# Patient Record
Sex: Male | Born: 1970 | Race: Black or African American | Hispanic: No | Marital: Married | Smoking: Former smoker
Health system: Southern US, Community
[De-identification: ages and names within clinical notes are randomized; demographics above are authoritative.]

## PROBLEM LIST (undated history)

## (undated) DIAGNOSIS — M069 Rheumatoid arthritis, unspecified: Secondary | ICD-10-CM

## (undated) DIAGNOSIS — K611 Rectal abscess: Secondary | ICD-10-CM

## (undated) DIAGNOSIS — D649 Anemia, unspecified: Secondary | ICD-10-CM

## (undated) DIAGNOSIS — N912 Amenorrhea, unspecified: Secondary | ICD-10-CM

## (undated) DIAGNOSIS — M545 Low back pain, unspecified: Secondary | ICD-10-CM

## (undated) DIAGNOSIS — K5790 Diverticulosis of intestine, part unspecified, without perforation or abscess without bleeding: Secondary | ICD-10-CM

## (undated) DIAGNOSIS — M160 Bilateral primary osteoarthritis of hip: Secondary | ICD-10-CM

## (undated) DIAGNOSIS — M4800 Spinal stenosis, site unspecified: Secondary | ICD-10-CM

## (undated) DIAGNOSIS — K605 Anorectal fistula, unspecified: Secondary | ICD-10-CM

## (undated) DIAGNOSIS — D472 Monoclonal gammopathy: Secondary | ICD-10-CM

## (undated) DIAGNOSIS — K5090 Crohn's disease, unspecified, without complications: Secondary | ICD-10-CM

## (undated) DIAGNOSIS — M47816 Spondylosis without myelopathy or radiculopathy, lumbar region: Secondary | ICD-10-CM

## (undated) DIAGNOSIS — M7121 Synovial cyst of popliteal space [Baker], right knee: Secondary | ICD-10-CM

## (undated) DIAGNOSIS — I959 Hypotension, unspecified: Secondary | ICD-10-CM

## (undated) HISTORY — DX: Amenorrhea, unspecified: N91.2

## (undated) HISTORY — DX: Anemia, unspecified: D64.9

## (undated) HISTORY — DX: Crohn's disease, unspecified, without complications: K50.90

## (undated) HISTORY — DX: Hypotension, unspecified: I95.9

---

## 2003-03-31 HISTORY — PX: BONE MARROW BIOPSY: SHX199

## 2003-06-19 ENCOUNTER — Encounter: Admission: RE | Admit: 2003-06-19 | Discharge: 2003-06-19 | Payer: Self-pay | Admitting: Rheumatology

## 2004-12-18 ENCOUNTER — Encounter: Admission: RE | Admit: 2004-12-18 | Discharge: 2004-12-18 | Payer: Self-pay

## 2011-03-31 DIAGNOSIS — K611 Rectal abscess: Secondary | ICD-10-CM

## 2011-03-31 HISTORY — DX: Rectal abscess: K61.1

## 2011-03-31 HISTORY — PX: CIRCUMCISION: SUR203

## 2011-09-14 ENCOUNTER — Encounter (HOSPITAL_BASED_OUTPATIENT_CLINIC_OR_DEPARTMENT_OTHER): Payer: Self-pay

## 2011-09-14 ENCOUNTER — Emergency Department (HOSPITAL_BASED_OUTPATIENT_CLINIC_OR_DEPARTMENT_OTHER): Payer: BC Managed Care – PPO

## 2011-09-14 ENCOUNTER — Emergency Department (HOSPITAL_BASED_OUTPATIENT_CLINIC_OR_DEPARTMENT_OTHER)
Admission: EM | Admit: 2011-09-14 | Discharge: 2011-09-14 | Disposition: A | Discharge status: Home / Self Care | Payer: BC Managed Care – PPO | Attending: Emergency Medicine | Admitting: Emergency Medicine

## 2011-09-14 DIAGNOSIS — K612 Anorectal abscess: Secondary | ICD-10-CM | POA: Insufficient documentation

## 2011-09-14 DIAGNOSIS — L0231 Cutaneous abscess of buttock: Secondary | ICD-10-CM | POA: Insufficient documentation

## 2011-09-14 DIAGNOSIS — L0291 Cutaneous abscess, unspecified: Secondary | ICD-10-CM

## 2011-09-14 HISTORY — DX: Monoclonal gammopathy: D47.2

## 2011-09-14 LAB — DIFFERENTIAL
Basophils Relative: 0 % (ref 0–1)
Eosinophils Absolute: 0.1 10*3/uL (ref 0.0–0.7)
Eosinophils Relative: 0 % (ref 0–5)
Lymphs Abs: 1.5 10*3/uL (ref 0.7–4.0)
Monocytes Absolute: 1.2 10*3/uL — ABNORMAL HIGH (ref 0.1–1.0)
Monocytes Relative: 9 % (ref 3–12)

## 2011-09-14 LAB — BASIC METABOLIC PANEL
BUN: 7 mg/dL (ref 6–23)
Calcium: 9.2 mg/dL (ref 8.4–10.5)
Creatinine, Ser: 0.7 mg/dL (ref 0.50–1.35)
GFR calc Af Amer: >90 mL/min (ref >90)
GFR calc non Af Amer: >90 mL/min (ref >90)
Glucose, Bld: 90 mg/dL (ref 70–99)

## 2011-09-14 LAB — URINE MICROSCOPIC-ADD ON

## 2011-09-14 LAB — URINALYSIS, ROUTINE W REFLEX MICROSCOPIC
Ketones, ur: 15 mg/dL — AB
Protein, ur: 30 mg/dL — AB
Specific Gravity, Urine: 1.026 (ref 1.005–1.030)
Urobilinogen, UA: 1 mg/dL (ref 0.0–1.0)

## 2011-09-14 LAB — CBC
Hemoglobin: 12.2 g/dL — ABNORMAL LOW (ref 13.0–17.0)
MCH: 25.7 pg — ABNORMAL LOW (ref 26.0–34.0)
MCHC: 34.3 g/dL (ref 30.0–36.0)
MCV: 74.9 fL — ABNORMAL LOW (ref 78.0–100.0)
RBC: 4.75 MIL/uL (ref 4.22–5.81)

## 2011-09-14 MED ORDER — ONDANSETRON HCL 4 MG/2ML IJ SOLN
4.0000 mg | Freq: Once | INTRAMUSCULAR | Status: AC
  Administered 2011-09-14: 4 mg via INTRAVENOUS
  Filled 2011-09-14: qty 2

## 2011-09-14 MED ORDER — OXYCODONE-ACETAMINOPHEN 5-325 MG PO TABS: 2.0000 | ORAL_TABLET | Freq: Once | ORAL | Status: AC

## 2011-09-14 MED ORDER — SULFAMETHOXAZOLE-TMP DS 800-160 MG PO TABS
1.0000 | ORAL_TABLET | Freq: Once | ORAL | Status: AC
  Administered 2011-09-14: 1 via ORAL
  Filled 2011-09-14: qty 1

## 2011-09-14 MED ORDER — MORPHINE SULFATE 4 MG/ML IJ SOLN
4.0000 mg | Freq: Once | INTRAMUSCULAR | Status: AC
  Administered 2011-09-14: 4 mg via INTRAVENOUS
  Filled 2011-09-14: qty 1

## 2011-09-14 MED ORDER — SULFAMETHOXAZOLE-TMP DS 800-160 MG PO TABS: 1.0000 | ORAL_TABLET | Freq: Two times a day (BID) | ORAL | Status: DC

## 2011-09-14 MED ORDER — OXYCODONE-ACETAMINOPHEN 5-325 MG PO TABS
2.0000 | ORAL_TABLET | Freq: Once | ORAL | Status: AC
  Administered 2011-09-14: 2 via ORAL
  Filled 2011-09-14: qty 2

## 2011-09-14 MED ORDER — IOHEXOL 300 MG/ML  SOLN
100.0000 mL | Freq: Once | INTRAMUSCULAR | Status: AC | PRN
  Administered 2011-09-14: 100 mL via INTRAVENOUS

## 2011-09-14 MED ORDER — LIDOCAINE HCL (PF) 1 % IJ SOLN
5.0000 mL | Freq: Once | INTRAMUSCULAR | Status: AC
  Administered 2011-09-14: 5 mL
  Filled 2011-09-14: qty 5

## 2011-09-14 NOTE — Discharge Instructions (Signed)

## 2011-09-14 NOTE — ED Provider Notes (Signed)
History   This chart was scribed for Malvin Johns, MD by Kathreen Cornfield. The patient was seen in room MH05/MH05 and the patient's care was started at 5:27 PM     CSN: [id number]  Arrival date & time 09/14/11  1629   First MD Initiated Contact with Patient 09/14/11 1703      Chief Complaint  Patient presents with  . Wound Infection    (Consider location/radiation/quality/duration/timing/severity/associated sxs/prior treatment) HPI  Kerry Harrison is a 41 y.o. male who presents to the Emergency Department complaining of moderate, constant wound infection onset one week ago with associated symptoms of nausea, fever, diarrhea. The pt states "I have an abscess." Pt informs the EDP that "I went to wipe and there was a hard spot." Pt states "it has been like this for a couple of months.Over last week, the swelling has markedly increased and become more painful.  Has been using ice to area.  The pt informs the EDP that "he has went to the bathroom four times." Pt has a hx of stomach virus (diagnosed last month) for which he was taking "antibiotics".  Does have some pain to lower abd and pain to abscess area with BM.  +fevers, subjective.  +hx of MRSA in family   Past Medical History  Diagnosis Date  . Arthritis   . Monoclonal gammopathy       History  Substance Use Topics  . Smoking status: Never Smoker   . Smokeless tobacco: Never Used  . Alcohol Use: No      Review of Systems  Constitutional: Negative for fever, chills, diaphoresis and fatigue.  HENT: Negative for congestion, rhinorrhea and sneezing.   Eyes: Negative.   Respiratory: Negative for cough, chest tightness and shortness of breath.   Cardiovascular: Negative for chest pain and leg swelling.  Gastrointestinal: Positive for abdominal pain and rectal pain. Negative for nausea, vomiting, diarrhea and blood in stool.  Genitourinary: Negative for frequency, hematuria, flank pain and difficulty urinating.  Musculoskeletal:  Negative for back pain and arthralgias.  Skin: Negative for rash.  Neurological: Negative for dizziness, speech difficulty, weakness, numbness and headaches.    Allergies  Review of patient's allergies indicates no known allergies.  Home Medications   Current Outpatient Rx  Name Route Sig Dispense Refill  . NORTRIPTYLINE HCL 10 MG PO CAPS Oral Take 10 mg by mouth at bedtime.    . SULFAMETHOXAZOLE-TMP DS 800-160 MG PO TABS Oral Take 1 tablet by mouth 2 (two) times daily.    . TRAMADOL HCL 50 MG PO TABS Oral Take 50 mg by mouth every 6 (six) hours as needed. Patient used this medication for pain.    . OXYCODONE-ACETAMINOPHEN 5-325 MG PO TABS Oral Take 2 tablets by mouth once. 30 tablet 0  . SULFAMETHOXAZOLE-TMP DS 800-160 MG PO TABS Oral Take 1 tablet by mouth 2 (two) times daily. 20 tablet 0    BP 110/77  Pulse 117  Temp 98.4 F (36.9 C) (Oral)  Resp 18  Ht 6' 1"  (1.854 m)  Wt 246 lb (111.585 kg)  BMI 32.46 kg/m2  SpO2 98%  Physical Exam  Nursing note and vitals reviewed. Constitutional: He is oriented to person, place, and time. He appears well-developed and well-nourished.  HENT:  Head: Normocephalic and atraumatic.  Eyes: Pupils are equal, round, and reactive to light.  Neck: Normal range of motion. Neck supple.  Cardiovascular: Normal rate, regular rhythm and normal heart sounds.   Pulmonary/Chest: Effort normal and breath sounds  normal. No respiratory distress. He has no wheezes. He has no rales. He exhibits no tenderness.  Abdominal: Soft. Bowel sounds are normal. There is tenderness (mild across the lower abd). There is no rebound and no guarding.  Genitourinary:       2 cm fluctuant area to left buttocks adjacent to the anus, with pain to the perirectal area.   Musculoskeletal: Normal range of motion. He exhibits no edema.  Lymphadenopathy:    He has no cervical adenopathy.  Neurological: He is alert and oriented to person, place, and time.  Skin: Skin is warm and  dry. No rash noted.  Psychiatric: He has a normal mood and affect.    ED Course  INCISION AND DRAINAGE Date/Time: 09/14/2011 8:37 PM Performed by: Duran Ohern Authorized by: Malvin Johns Consent: Verbal consent obtained. Risks and benefits: risks, benefits and alternatives were discussed Consent given by: patient Patient understanding: patient states understanding of the procedure being performed Patient identity confirmed: verbally with patient Time out: Immediately prior to procedure a "time out" was called to verify the correct patient, procedure, equipment, support staff and site/side marked as required. Type: abscess Location: left buttocks. Anesthesia: local infiltration Local anesthetic: lidocaine 1% without epinephrine Anesthetic total: 5 ml Patient sedated: no Scalpel size: 11 Incision type: single straight Complexity: simple Drainage: purulent Drainage amount: copious Wound treatment: wound left open Packing material: 1/4 in iodoform gauze Patient tolerance: Patient tolerated the procedure well with no immediate complications.   (including critical care time)  DIAGNOSTIC STUDIES: Oxygen Saturation is 98% on room air, normal by my interpretation.    COORDINATION OF CARE:   5:31PM- EDP at bedside discusses treatment plan concerning CT scan.    Results for orders placed during the hospital encounter of 09/14/11  CBC      Component Value Range   WBC 13.0 (*) 4.0 - 10.5 K/uL   RBC 4.75  4.22 - 5.81 MIL/uL   Hemoglobin 12.2 (*) 13.0 - 17.0 g/dL   HCT 35.6 (*) 39.0 - 52.0 %   MCV 74.9 (*) 78.0 - 100.0 fL   MCH 25.7 (*) 26.0 - 34.0 pg   MCHC 34.3  30.0 - 36.0 g/dL   RDW 16.1 (*) 11.5 - 15.5 %   Platelets 386  150 - 400 K/uL  DIFFERENTIAL      Component Value Range   Neutrophils Relative 79 (*) 43 - 77 %   Neutro Abs 10.3 (*) 1.7 - 7.7 K/uL   Lymphocytes Relative 11 (*) 12 - 46 %   Lymphs Abs 1.5  0.7 - 4.0 K/uL   Monocytes Relative 9  3 - 12 %    Monocytes Absolute 1.2 (*) 0.1 - 1.0 K/uL   Eosinophils Relative 0  0 - 5 %   Eosinophils Absolute 0.1  0.0 - 0.7 K/uL   Basophils Relative 0  0 - 1 %   Basophils Absolute 0.0  0.0 - 0.1 K/uL  BASIC METABOLIC PANEL      Component Value Range   Sodium 131 (*) 135 - 145 mEq/L   Potassium 3.9  3.5 - 5.1 mEq/L   Chloride 95 (*) 96 - 112 mEq/L   CO2 26  19 - 32 mEq/L   Glucose, Bld 90  70 - 99 mg/dL   BUN 7  6 - 23 mg/dL   Creatinine, Ser 0.70  0.50 - 1.35 mg/dL   Calcium 9.2  8.4 - 10.5 mg/dL   GFR calc non Af Amer >90  >  90 mL/min   GFR calc Af Amer >90  >90 mL/min  URINALYSIS, ROUTINE W REFLEX MICROSCOPIC      Component Value Range   Color, Urine AMBER (*) YELLOW   APPearance CLOUDY (*) CLEAR   Specific Gravity, Urine 1.026  1.005 - 1.030   pH 5.5  5.0 - 8.0   Glucose, UA NEGATIVE  NEGATIVE mg/dL   Hgb urine dipstick MODERATE (*) NEGATIVE   Bilirubin Urine SMALL (*) NEGATIVE   Ketones, ur 15 (*) NEGATIVE mg/dL   Protein, ur 30 (*) NEGATIVE mg/dL   Urobilinogen, UA 1.0  0.0 - 1.0 mg/dL   Nitrite NEGATIVE  NEGATIVE   Leukocytes, UA NEGATIVE  NEGATIVE  URINE MICROSCOPIC-ADD ON      Component Value Range   Squamous Epithelial / LPF FEW (*) RARE   RBC / HPF 7-10  <3 RBC/hpf   Bacteria, UA RARE  RARE   Urine-Other MUCOUS PRESENT       Ct Pelvis W Contrast  09/14/2011  *RADIOLOGY REPORT*  Clinical Data:  Evaluate for perirectal abscess.  CT PELVIS WITH CONTRAST  Technique:  Multidetector CT imaging of the pelvis was performed using the standard protocol following the bolus administration of intravenous contrast.  Contrast: 159m OMNIPAQUE IOHEXOL 300 MG/ML  SOLN  Comparison:   None.  Findings:  There is a fluid collection with a faint rim of peripheral enhancement in the subcutaneous soft tissues of the medial left buttocks, likely extending to the skin within the gluteal cleft.  Fluid collection measures 2.2 x 1.9 x 3.3 cm. There is some stranding in the adjacent subcutaneous fat.  No  intra-abdominal fluid collection, free fluid, or abscess is identified.  The appendix is seen on images 13-16 and is normal. Urinary bladder and prostate gland are within normal limits.  The visualized bowel loops are normal in caliber. Rectum appears within normal limits.  Reactive size of left pelvic sidewall lymph node measures 6 mm short axis.  No pathologically enlarged lymph nodes.  No acute bony abnormality.  IMPRESSION:  Left perianal abscess measures 2.2 x 1.9 x 3.3 cm.  Original Report Authenticated By: SCurlene Dolphin M.D.     1. Abscess       MDM  No evidence of abscess extending into perirectal area.  Was drained here with large amount of drainage, wound culture sent, started on abx, will return in 2 days for wound check      I personally performed the services described in this documentation, which was scribed in my presence.  The recorded information has been reviewed and considered.    MMalvin Johns MD 09/14/11 2039

## 2011-09-14 NOTE — ED Notes (Signed)
Boil on buttock x 1 week.

## 2011-09-16 ENCOUNTER — Emergency Department (HOSPITAL_BASED_OUTPATIENT_CLINIC_OR_DEPARTMENT_OTHER)
Admission: EM | Admit: 2011-09-16 | Discharge: 2011-09-16 | Disposition: A | Discharge status: Home / Self Care | Payer: BC Managed Care – PPO | Source: Home / Self Care | Attending: Emergency Medicine | Admitting: Emergency Medicine

## 2011-09-16 ENCOUNTER — Encounter (HOSPITAL_COMMUNITY): Admission: EM | Disposition: A | Discharge status: Home / Self Care | Payer: Self-pay | Source: Home / Self Care

## 2011-09-16 ENCOUNTER — Encounter (HOSPITAL_BASED_OUTPATIENT_CLINIC_OR_DEPARTMENT_OTHER): Payer: Self-pay | Admitting: *Deleted

## 2011-09-16 ENCOUNTER — Encounter (HOSPITAL_COMMUNITY): Payer: Self-pay | Admitting: Anesthesiology

## 2011-09-16 ENCOUNTER — Encounter (HOSPITAL_COMMUNITY): Payer: Self-pay | Admitting: *Deleted

## 2011-09-16 ENCOUNTER — Emergency Department (HOSPITAL_COMMUNITY): Payer: BC Managed Care – PPO | Admitting: Anesthesiology

## 2011-09-16 ENCOUNTER — Emergency Department (HOSPITAL_BASED_OUTPATIENT_CLINIC_OR_DEPARTMENT_OTHER): Payer: BC Managed Care – PPO

## 2011-09-16 ENCOUNTER — Inpatient Hospital Stay (HOSPITAL_COMMUNITY)
Admission: EM | Admit: 2011-09-16 | Discharge: 2011-09-18 | DRG: 158 | Disposition: A | Discharge status: Home / Self Care | Payer: BC Managed Care – PPO | Attending: Surgery | Admitting: Surgery

## 2011-09-16 DIAGNOSIS — K603 Anal fistula, unspecified: Secondary | ICD-10-CM | POA: Insufficient documentation

## 2011-09-16 DIAGNOSIS — D472 Monoclonal gammopathy: Secondary | ICD-10-CM | POA: Diagnosis present

## 2011-09-16 DIAGNOSIS — R109 Unspecified abdominal pain: Secondary | ICD-10-CM | POA: Insufficient documentation

## 2011-09-16 DIAGNOSIS — K605 Anorectal fistula: Secondary | ICD-10-CM

## 2011-09-16 DIAGNOSIS — K6289 Other specified diseases of anus and rectum: Secondary | ICD-10-CM | POA: Insufficient documentation

## 2011-09-16 DIAGNOSIS — K612 Anorectal abscess: Principal | ICD-10-CM | POA: Diagnosis present

## 2011-09-16 DIAGNOSIS — R110 Nausea: Secondary | ICD-10-CM | POA: Insufficient documentation

## 2011-09-16 DIAGNOSIS — K610 Anal abscess: Secondary | ICD-10-CM

## 2011-09-16 DIAGNOSIS — R599 Enlarged lymph nodes, unspecified: Secondary | ICD-10-CM | POA: Insufficient documentation

## 2011-09-16 HISTORY — PX: INCISION AND DRAINAGE PERIRECTAL ABSCESS: SHX1804

## 2011-09-16 LAB — DIFFERENTIAL
Basophils Absolute: 0 10*3/uL (ref 0.0–0.1)
Basophils Relative: 0 % (ref 0–1)
Eosinophils Relative: 1 % (ref 0–5)
Monocytes Absolute: 1.2 10*3/uL — ABNORMAL HIGH (ref 0.1–1.0)
Neutro Abs: 12.6 10*3/uL — ABNORMAL HIGH (ref 1.7–7.7)

## 2011-09-16 LAB — CBC
HCT: 38.2 % — ABNORMAL LOW (ref 39.0–52.0)
MCHC: 34.6 g/dL (ref 30.0–36.0)
Platelets: 473 10*3/uL — ABNORMAL HIGH (ref 150–400)
RDW: 16.2 % — ABNORMAL HIGH (ref 11.5–15.5)

## 2011-09-16 LAB — BASIC METABOLIC PANEL
Calcium: 9.7 mg/dL (ref 8.4–10.5)
Chloride: 94 mEq/L — ABNORMAL LOW (ref 96–112)
Creatinine, Ser: 0.7 mg/dL (ref 0.50–1.35)
GFR calc Af Amer: >90 mL/min (ref >90)

## 2011-09-16 SURGERY — INCISION AND DRAINAGE, ABSCESS, PERIRECTAL
Anesthesia: General | Site: Anus | Wound class: Dirty or Infected

## 2011-09-16 MED ORDER — METRONIDAZOLE IN NACL 5-0.79 MG/ML-% IV SOLN
INTRAVENOUS | Status: AC
  Filled 2011-09-16: qty 100

## 2011-09-16 MED ORDER — CEPHALEXIN 500 MG PO CAPS: 500.0000 mg | ORAL_CAPSULE | Freq: Four times a day (QID) | ORAL | Status: DC

## 2011-09-16 MED ORDER — GLYCOPYRROLATE 0.2 MG/ML IJ SOLN
INTRAMUSCULAR | Status: DC | PRN
  Administered 2011-09-16: .7 mg via INTRAVENOUS

## 2011-09-16 MED ORDER — ACETAMINOPHEN 10 MG/ML IV SOLN
INTRAVENOUS | Status: AC
  Filled 2011-09-16: qty 100

## 2011-09-16 MED ORDER — IOHEXOL 300 MG/ML  SOLN
100.0000 mL | Freq: Once | INTRAMUSCULAR | Status: AC | PRN
  Administered 2011-09-16: 100 mL via INTRAVENOUS

## 2011-09-16 MED ORDER — FENTANYL CITRATE 0.05 MG/ML IJ SOLN
INTRAMUSCULAR | Status: DC | PRN
  Administered 2011-09-16: 100 ug via INTRAVENOUS

## 2011-09-16 MED ORDER — NEOSTIGMINE METHYLSULFATE 1 MG/ML IJ SOLN
INTRAMUSCULAR | Status: DC | PRN
  Administered 2011-09-16: 5 mg via INTRAVENOUS

## 2011-09-16 MED ORDER — HYDROMORPHONE HCL PF 1 MG/ML IJ SOLN: 0.2500 mg | INTRAMUSCULAR | Status: DC | PRN

## 2011-09-16 MED ORDER — LACTATED RINGERS IV SOLN
INTRAVENOUS | Status: DC
  Administered 2011-09-16: 1000 mL via INTRAVENOUS

## 2011-09-16 MED ORDER — SUCCINYLCHOLINE CHLORIDE 20 MG/ML IJ SOLN
INTRAMUSCULAR | Status: DC | PRN
  Administered 2011-09-16: 100 mg via INTRAVENOUS

## 2011-09-16 MED ORDER — DOXYCYCLINE HYCLATE 100 MG PO CAPS: 100.0000 mg | ORAL_CAPSULE | Freq: Two times a day (BID) | ORAL | Status: DC

## 2011-09-16 MED ORDER — BUPIVACAINE HCL (PF) 0.25 % IJ SOLN
INTRAMUSCULAR | Status: DC | PRN
  Administered 2011-09-16: 20 mL

## 2011-09-16 MED ORDER — METRONIDAZOLE IN NACL 5-0.79 MG/ML-% IV SOLN
500.0000 mg | Freq: Three times a day (TID) | INTRAVENOUS | Status: DC
  Administered 2011-09-16 – 2011-09-18 (×6): 500 mg via INTRAVENOUS
  Filled 2011-09-16 (×10): qty 100

## 2011-09-16 MED ORDER — BUPIVACAINE HCL (PF) 0.25 % IJ SOLN
INTRAMUSCULAR | Status: AC
  Filled 2011-09-16: qty 30

## 2011-09-16 MED ORDER — ROCURONIUM BROMIDE 100 MG/10ML IV SOLN
INTRAVENOUS | Status: DC | PRN
  Administered 2011-09-16: 20 mg via INTRAVENOUS

## 2011-09-16 MED ORDER — MORPHINE SULFATE 2 MG/ML IJ SOLN
2.0000 mg | INTRAMUSCULAR | Status: DC | PRN
  Administered 2011-09-16 – 2011-09-17 (×2): 2 mg via INTRAVENOUS
  Filled 2011-09-16 (×2): qty 1

## 2011-09-16 MED ORDER — CIPROFLOXACIN IN D5W 400 MG/200ML IV SOLN
400.0000 mg | Freq: Two times a day (BID) | INTRAVENOUS | Status: DC
  Administered 2011-09-16 – 2011-09-18 (×5): 400 mg via INTRAVENOUS
  Filled 2011-09-16 (×5): qty 200

## 2011-09-16 MED ORDER — ENOXAPARIN SODIUM 40 MG/0.4ML ~~LOC~~ SOLN
40.0000 mg | SUBCUTANEOUS | Status: DC
  Filled 2011-09-16 (×3): qty 0.4

## 2011-09-16 MED ORDER — SODIUM CHLORIDE 0.9 % IV SOLN
Freq: Once | INTRAVENOUS | Status: AC
  Administered 2011-09-16: 1000 mL via INTRAVENOUS

## 2011-09-16 MED ORDER — MIDAZOLAM HCL 5 MG/5ML IJ SOLN
INTRAMUSCULAR | Status: DC | PRN
  Administered 2011-09-16: 2 mg via INTRAVENOUS

## 2011-09-16 MED ORDER — LIDOCAINE-EPINEPHRINE 1 %-1:100000 IJ SOLN
INTRAMUSCULAR | Status: DC | PRN
  Administered 2011-09-16: 20 mL

## 2011-09-16 MED ORDER — SODIUM CHLORIDE 0.9 % IV BOLUS (SEPSIS)
1000.0000 mL | Freq: Once | INTRAVENOUS | Status: AC
  Administered 2011-09-16: 1000 mL via INTRAVENOUS

## 2011-09-16 MED ORDER — CIPROFLOXACIN IN D5W 400 MG/200ML IV SOLN
INTRAVENOUS | Status: AC
  Filled 2011-09-16: qty 200

## 2011-09-16 MED ORDER — HYDROMORPHONE HCL PF 1 MG/ML IJ SOLN
1.0000 mg | Freq: Once | INTRAMUSCULAR | Status: AC
  Administered 2011-09-16: 1 mg via INTRAMUSCULAR
  Filled 2011-09-16: qty 1

## 2011-09-16 MED ORDER — NORTRIPTYLINE HCL 10 MG PO CAPS
10.0000 mg | ORAL_CAPSULE | Freq: Every day | ORAL | Status: DC
  Administered 2011-09-16 – 2011-09-17 (×2): 10 mg via ORAL
  Filled 2011-09-16 (×3): qty 1

## 2011-09-16 MED ORDER — ACETAMINOPHEN 10 MG/ML IV SOLN
INTRAVENOUS | Status: DC | PRN
  Administered 2011-09-16: 1000 mg via INTRAVENOUS

## 2011-09-16 MED ORDER — ONDANSETRON HCL 4 MG/2ML IJ SOLN
INTRAMUSCULAR | Status: DC | PRN
  Administered 2011-09-16: 4 mg via INTRAVENOUS

## 2011-09-16 MED ORDER — DEXTROSE 5 % IV SOLN
1.0000 g | Freq: Once | INTRAVENOUS | Status: AC
  Administered 2011-09-16: 1 g via INTRAVENOUS
  Filled 2011-09-16: qty 10

## 2011-09-16 MED ORDER — 0.9 % SODIUM CHLORIDE (POUR BTL) OPTIME
TOPICAL | Status: DC | PRN
  Administered 2011-09-16: 1000 mL

## 2011-09-16 MED ORDER — OXYCODONE-ACETAMINOPHEN 5-325 MG PO TABS
2.0000 | ORAL_TABLET | ORAL | Status: DC | PRN
  Administered 2011-09-17 – 2011-09-18 (×3): 2 via ORAL
  Filled 2011-09-16 (×3): qty 2

## 2011-09-16 MED ORDER — LIDOCAINE-EPINEPHRINE 1 %-1:100000 IJ SOLN
INTRAMUSCULAR | Status: AC
  Filled 2011-09-16: qty 1

## 2011-09-16 MED ORDER — POTASSIUM CHLORIDE CRYS ER 20 MEQ PO TBCR
40.0000 meq | EXTENDED_RELEASE_TABLET | Freq: Once | ORAL | Status: AC
  Administered 2011-09-16: 40 meq via ORAL
  Filled 2011-09-16: qty 2

## 2011-09-16 MED ORDER — MORPHINE SULFATE 10 MG/ML IJ SOLN
2.0000 mg | INTRAMUSCULAR | Status: DC | PRN
  Administered 2011-09-16 (×2): 2 mg via INTRAVENOUS
  Filled 2011-09-16 (×3): qty 1

## 2011-09-16 MED ORDER — PROPOFOL 10 MG/ML IV BOLUS
INTRAVENOUS | Status: DC | PRN
  Administered 2011-09-16: 200 mg via INTRAVENOUS

## 2011-09-16 SURGICAL SUPPLY — 35 items
BLADE SURG 15 STRL LF DISP TIS (BLADE) ×1 IMPLANT
BLADE SURG 15 STRL SS (BLADE) ×2
BRIEF STRETCH FOR OB PAD LRG (UNDERPADS AND DIAPERS) ×2 IMPLANT
CANISTER SUCTION 2500CC (MISCELLANEOUS) ×2 IMPLANT
CLOTH BEACON ORANGE TIMEOUT ST (SAFETY) ×2 IMPLANT
COVER SURGICAL LIGHT HANDLE (MISCELLANEOUS) ×2 IMPLANT
DECANTER SPIKE VIAL GLASS SM (MISCELLANEOUS) ×2 IMPLANT
DRAIN PENROSE 18X1/2 LTX STRL (DRAIN) ×1 IMPLANT
DRAIN PENROSE 18X1/4 LTX STRL (WOUND CARE) ×1 IMPLANT
DRAPE LAPAROTOMY T 102X78X121 (DRAPES) ×1 IMPLANT
DRAPE LG THREE QUARTER DISP (DRAPES) ×1 IMPLANT
DRSG PAD ABDOMINAL 8X10 ST (GAUZE/BANDAGES/DRESSINGS) ×1 IMPLANT
ELECT CAUTERY BLADE 6.4 (BLADE) ×2 IMPLANT
ELECT REM PT RETURN 9FT ADLT (ELECTROSURGICAL) ×2
ELECTRODE REM PT RTRN 9FT ADLT (ELECTROSURGICAL) ×1 IMPLANT
GAUZE PACKING IODOFORM 1/4X5 (PACKING) ×1 IMPLANT
GAUZE SPONGE 4X4 16PLY XRAY LF (GAUZE/BANDAGES/DRESSINGS) ×2 IMPLANT
GLOVE BIOGEL PI IND STRL 7.0 (GLOVE) ×1 IMPLANT
GLOVE BIOGEL PI INDICATOR 7.0 (GLOVE) ×1
GLOVE SURG SS PI 7.5 STRL IVOR (GLOVE) ×8 IMPLANT
GOWN STRL NON-REIN LRG LVL3 (GOWN DISPOSABLE) ×3 IMPLANT
GOWN STRL REIN XL XLG (GOWN DISPOSABLE) ×4 IMPLANT
KIT BASIN OR (CUSTOM PROCEDURE TRAY) ×2 IMPLANT
LUBRICANT JELLY K Y 4OZ (MISCELLANEOUS) ×2 IMPLANT
NEEDLE HYPO 22GX1.5 SAFETY (NEEDLE) ×2 IMPLANT
NS IRRIG 1000ML POUR BTL (IV SOLUTION) ×2 IMPLANT
PACK BASIC VI WITH GOWN DISP (CUSTOM PROCEDURE TRAY) ×2 IMPLANT
PENCIL BUTTON HOLSTER BLD 10FT (ELECTRODE) ×2 IMPLANT
SPONGE GAUZE 4X4 12PLY (GAUZE/BANDAGES/DRESSINGS) ×2 IMPLANT
SPONGE HEMORRHOID 8X3CM (HEMOSTASIS) ×1 IMPLANT
SPONGE SURGIFOAM ABS GEL 100 (HEMOSTASIS) ×1 IMPLANT
SUT ETHILON 3 0 PS 1 (SUTURE) ×1 IMPLANT
SYR CONTROL 10ML LL (SYRINGE) ×2 IMPLANT
TOWEL OR 17X26 10 PK STRL BLUE (TOWEL DISPOSABLE) ×3 IMPLANT
YANKAUER SUCT BULB TIP 10FT TU (MISCELLANEOUS) ×2 IMPLANT

## 2011-09-16 NOTE — Discharge Instructions (Signed)
Anal Fistula An anal fistula is an abnormal tunnel that leads from the anal canal (which carries stool from the large intestine) to a hole in the skin near the anus (the opening through which stool passes out of your body).  CAUSES  Food you eat goes from your stomach into your intestine. As the food is digested, waste material (stool) forms. Stool passes through your large intestine, through the rectum and anal canal, and out of your body through the anus.  The anus has a number of tiny glands (clusters of specialized cells) that make lubricating fluid. Sometimes these glands can become infected. This type of infection may lead to the development of a pocket of pus (abscess). An anal fistula often develops after an infection or abscess; It is nearly always caused by a past anorectal abscess. You are at a higher risk of developing an anal fistula if you have:  Had an anal abscess.   Chronic inflammatory bowel disease, such as Crohn's disease or ulcerative colitis.   Conditions in which there are inflamed outpouchings of the intestinal wall (diverticulitis).   Colon or rectal cancer.   Sexually transmitted diseases involving the rectum, such as gonorrhea or chlamydia.   A history of anal radiation treatments, injury, or surgery.   An HIV infection.   A problem that has required treatment with steroid medicines for more than a short time.  SYMPTOMS   Anal pain, particularly around the area of a past abscess.   Drainage of pus, blood, stool or mucus from an opening in the skin.   Swelling around the skin opening.   Worn off skin around the opening.   A hot or red area near the anus.   Diarrhea.   Fever and chills.   Tiredness (fatigue).  DIAGNOSIS   In some cases, the opening of an anal fistula is easily seen during a physical exam.   A probe or scope may be used to help locate the opening of the fistula. In some cases, dye can be injected into the fistula opening, and X-rays  can be taken to find the exact location and path of the fistula.   A sample (biopsy) of the fistula tissue or anus may be taken to check for cancer.  TREATMENT   An anal fistula may need surgery to open it up and allow it to heal. This type of operation is called a fistulotomy.   A specialized kind of glue or plug to seal the fistula may be used.   An antibiotic may be prescribed to treat an existing infection.  HOME CARE INSTRUCTIONS   Take medications (such as antibiotics) as prescribed by your caregiver.   Only take over-the-counter or prescription medicine for pain, discomfort, or fever as directed by your caregiver.   Follow your prescribed diet. You may need a higher fiber diet to help avoid constipation.   Drink lots of water as directed.   Use a stool softener or laxative, if recommended.   A warm sitz bath several times a day may be soothing, as well as help with healing.   Follow excellent hygiene to keep the anal area as clean as possible. Consider using pre-moistened towelettes to keep the anal area clean after using the bathroom.  SEEK MEDICAL CARE IF:  You have increased pain not controlled with medications.   You notice new swelling, redness, or hotness in the anal area.   You develop any problems passing urine.   You develop a fever (more  than 100.5 F (38.1 C).  SEEK IMMEDIATE MEDICAL CARE IF:  You have severe, intolerable pain.   You have severe problems passing urine or cannot pass any urine at all.   You develop an unexplained oral temperature above 102.0 F (38.9 C).   You notice new or worsening leakage of blood, pus, mucus, or stool.  Document Released: 02/27/2008 Document Revised: 03/05/2011 Document Reviewed: 02/27/2008 [facility] Patient Information 2012 [place].

## 2011-09-16 NOTE — ED Notes (Signed)
Pt reports having an abscess on inner left buttock that was drained and packed this past Monday. Came back to the ER for a wound recheck and removal of packing. Pt reports pain in rectum and abdomen. States he has not been able to eat and has had nausea with the pain. Pt rates pain 8/10. Abscess still oozing drainage from the site.

## 2011-09-16 NOTE — ED Provider Notes (Signed)
Pt sent from [place] to see Gen Surg for perianal abscess. Please see her note for full H&P. Pt reports surgeon has been in the room to see him but does not remember his name. Awaiting OR at this time. Pain medications ordered. NPO.   Doneshia Hill B. Karle Starch, MD 09/16/11 [phone]

## 2011-09-16 NOTE — ED Notes (Signed)
Patient transported to CT 

## 2011-09-16 NOTE — ED Notes (Signed)
Pt came for follow up wound recheck and removal of packing to left buttock. Pt c/o of rectal pain, nausea, and abdominal pain.

## 2011-09-16 NOTE — Progress Notes (Signed)
Pt. Sleeping for long periods. Denies needing anything.

## 2011-09-16 NOTE — ED Provider Notes (Signed)
History     CSN: [id number]  Arrival date & time 09/16/11  0440   First MD Initiated Contact with Patient 09/16/11 0503      No chief complaint on file.   (Consider location/radiation/quality/duration/timing/severity/associated sxs/prior treatment) Patient is a 41 y.o. male presenting with abscess, wound check, and abdominal pain. The history is provided by the patient. No language interpreter was used.  Abscess  This is a recurrent problem. The current episode started more than one week ago. The onset was gradual. The problem occurs continuously. The problem has been gradually worsening. The abscess is present on the left buttock. The problem is moderate. The abscess is characterized by redness, painfulness, draining and swelling. It is unknown what he was exposed to. Incident location: none. Associated symptoms include anorexia. Pertinent negatives include no decrease in physical activity, not sleeping less, no fever, no diarrhea, no sore throat, no decreased responsiveness and no cough. There were no sick contacts. Recently, medical care has been given at this facility. Services received include medications given and tests performed.  Wound Check  He was treated in the ED today. Prior ED Treatment: Had ID and returns for packing removal and because symptoms are worse. Treatments since wound repair include oral antibiotics. His temperature was unmeasured prior to arrival. The swelling has worsened. The pain has worsened.  Abdominal Pain The primary symptoms of the illness include abdominal pain and nausea. The primary symptoms of the illness do not include fever or diarrhea. The current episode started more than 2 days ago (more than 6 months). The onset of the illness was gradual. The problem has not changed since onset. The abdominal pain began more than 2 days ago. The pain came on gradually. The abdominal pain has been unchanged since its onset. The abdominal pain is located in the  suprapubic region. The abdominal pain does not radiate.  Nausea began more than 1 week ago.  The illness is associated with recent antibiotic use. Additional symptoms associated with the illness include anorexia. Significant associated medical issues do not include diabetes.  Seen for  Gluteal cleft abscess 36 hours ago and returns for worsening symptoms.  Feels hard spot and states it is painful and difficult due to swelling to defecate.  States he is taking antibiotics  Past Medical History  Diagnosis Date  . Arthritis   . Monoclonal gammopathy     No past surgical history on file.  No family history on file.  History  Substance Use Topics  . Smoking status: Never Smoker   . Smokeless tobacco: Never Used  . Alcohol Use: No      Review of Systems  Constitutional: Positive for appetite change. Negative for fever and decreased responsiveness.  HENT: Negative for sore throat.   Respiratory: Negative for cough.   Gastrointestinal: Positive for nausea, abdominal pain and anorexia. Negative for diarrhea.  Skin: Positive for wound.  All other systems reviewed and are negative.    Allergies  Review of patient's allergies indicates no known allergies.  Home Medications   Current Outpatient Rx  Name Route Sig Dispense Refill  . NORTRIPTYLINE HCL 10 MG PO CAPS Oral Take 10 mg by mouth at bedtime.    . OXYCODONE-ACETAMINOPHEN 5-325 MG PO TABS Oral Take 2 tablets by mouth once. 30 tablet 0  . SULFAMETHOXAZOLE-TMP DS 800-160 MG PO TABS Oral Take 1 tablet by mouth 2 (two) times daily.    . SULFAMETHOXAZOLE-TMP DS 800-160 MG PO TABS Oral Take 1 tablet by mouth  2 (two) times daily. 20 tablet 0  . TRAMADOL HCL 50 MG PO TABS Oral Take 50 mg by mouth every 6 (six) hours as needed. Patient used this medication for pain.      BP 103/68  Pulse 122  Temp 97.7 F (36.5 C) (Oral)  Resp 24  SpO2 97%  Physical Exam  Constitutional: He appears well-developed and well-nourished. No  distress.  HENT:  Head: Normocephalic and atraumatic.  Mouth/Throat: Oropharynx is clear and moist.  Eyes: Conjunctivae are normal. Pupils are equal, round, and reactive to light.  Neck: Normal range of motion. Neck supple.  Cardiovascular: Normal rate and regular rhythm.   Pulmonary/Chest: Effort normal and breath sounds normal.  Abdominal: Soft. Bowel sounds are normal.  Skin: Skin is warm and dry.     Psychiatric: He has a normal mood and affect.    ED Course  Procedures (including critical care time)   Labs Reviewed  CBC  DIFFERENTIAL  BASIC METABOLIC PANEL   Ct Pelvis W Contrast  09/14/2011  *RADIOLOGY REPORT*  Clinical Data:  Evaluate for perirectal abscess.  CT PELVIS WITH CONTRAST  Technique:  Multidetector CT imaging of the pelvis was performed using the standard protocol following the bolus administration of intravenous contrast.  Contrast: 143m OMNIPAQUE IOHEXOL 300 MG/ML  SOLN  Comparison:   None.  Findings:  There is a fluid collection with a faint rim of peripheral enhancement in the subcutaneous soft tissues of the medial left buttocks, likely extending to the skin within the gluteal cleft.  Fluid collection measures 2.2 x 1.9 x 3.3 cm. There is some stranding in the adjacent subcutaneous fat.  No intra-abdominal fluid collection, free fluid, or abscess is identified.  The appendix is seen on images 13-16 and is normal. Urinary bladder and prostate gland are within normal limits.  The visualized bowel loops are normal in caliber. Rectum appears within normal limits.  Reactive size of left pelvic sidewall lymph node measures 6 mm short axis.  No pathologically enlarged lymph nodes.  No acute bony abnormality.  IMPRESSION:  Left perianal abscess measures 2.2 x 1.9 x 3.3 cm.  Original Report Authenticated By: SCurlene Dolphin M.D.     No diagnosis found.    MDM  [facility]  Will change abx to doxy keflex  Dr.  CVenora Maplesaware of transfer to ED       Fremon Zacharia K Tiffinie Caillier-Rasch, MD 09/16/11 [phone]

## 2011-09-16 NOTE — Transfer of Care (Signed)
Immediate Anesthesia Transfer of Care Note  Patient: Kerry Harrison. [name]  Procedure(s) Performed: Procedure(s) (LRB): IRRIGATION AND DEBRIDEMENT PERIRECTAL ABSCESS (N/A)  Patient Location: PACU  Anesthesia Type: General  Level of Consciousness: awake and oriented  Airway & Oxygen Therapy: Patient Spontanous Breathing and Patient connected to face mask oxygen  Post-op Assessment: Report given to PACU RN and Post -op Vital signs reviewed and stable  Post vital signs: Reviewed and stable  Complications: No apparent anesthesia complications

## 2011-09-16 NOTE — Anesthesia Preprocedure Evaluation (Signed)
Anesthesia Evaluation  Patient identified by MRN, date of birth, ID band Patient awake  General Assessment Comment:Npo today  Reviewed: Allergy & Precautions, H&P , NPO status , Patient's Chart, lab work & pertinent test results, reviewed documented beta blocker date and time   Airway Mallampati: II TM Distance: >3 FB Neck ROM: Full    Dental  (+) Teeth Intact and Dental Advisory Given   Pulmonary neg pulmonary ROS,  breath sounds clear to auscultation        Cardiovascular negative cardio ROS  Rhythm:Regular Rate:Normal  Denies cardiac symptoms   Neuro/Psych negative neurological ROS  negative psych ROS   GI/Hepatic Neg liver ROS, Peri-rectal abscess   Endo/Other  negative endocrine ROS  Renal/GU negative Renal ROS  negative genitourinary   Musculoskeletal negative musculoskeletal ROS (+)   Abdominal   Peds negative pediatric ROS (+)  Hematology negative hematology ROS (+)   Anesthesia Other Findings   Reproductive/Obstetrics negative OB ROS                           Anesthesia Physical Anesthesia Plan  ASA: I and Emergent  Anesthesia Plan: General   Post-op Pain Management:    Induction: Intravenous, Rapid sequence and Cricoid pressure planned  Airway Management Planned: Oral ETT  Additional Equipment:   Intra-op Plan:   Post-operative Plan: Extubation in OR  Informed Consent: I have reviewed the patients History and Physical, chart, labs and discussed the procedure including the risks, benefits and alternatives for the proposed anesthesia with the patient or authorized representative who has indicated his/her understanding and acceptance.   Dental advisory given  Plan Discussed with: CRNA and Surgeon  Anesthesia Plan Comments:         Anesthesia Quick Evaluation

## 2011-09-16 NOTE — Anesthesia Postprocedure Evaluation (Signed)
  Anesthesia Post-op Note  Patient: Kerry Harrison. [place]  Procedure(s) Performed: Procedure(s) (LRB): IRRIGATION AND DEBRIDEMENT PERIRECTAL ABSCESS (N/A)  Patient Location: PACU  Anesthesia Type: General  Level of Consciousness: oriented and sedated  Airway and Oxygen Therapy: Patient Spontanous Breathing and Patient connected to nasal cannula oxygen  Post-op Pain: mild  Post-op Assessment: Post-op Vital signs reviewed, Patient's Cardiovascular Status Stable, Respiratory Function Stable and Patent Airway  Post-op Vital Signs: stable  Complications: No apparent anesthesia complications

## 2011-09-16 NOTE — H&P (Signed)
Reason for Consult perirectal abscess Referring Physician: D[facility]  Worthy Keeler. Kerry Harrison is an 41 y.o. male.  HPI: this patient presents for evaluation of perirectal abscess. He has had increasing pain for the last 4-5 days in the perirectal area and he was seen previously in the emergency room approximately 2 days ago where he had an incision and drainage of. He was started on Bactrim at that time and has been on it since it however, since then has had increasing pain in the area is at extreme pain with bowel movements. Is swelling in the area of and has had 2 weeks of diarrhea as well. He denies any fevers or chills he does have a history of previous buttock abscess which required incision and drainage.  Past Medical History  Diagnosis Date  . Arthritis   . Monoclonal gammopathy     History reviewed. No pertinent past surgical history.  History reviewed. No pertinent family history.  Social History:  reports that he has never smoked. He has never used smokeless tobacco. He reports that he does not drink alcohol or use illicit drugs.  Allergies: No Known Allergies  Medications: I have reviewed the patient's current medications.  Results for orders placed during the hospital encounter of 09/16/11 (from the past 48 hour(s))  CBC     Status: Abnormal   Collection Time   09/16/11  5:12 AM      Component Value Range Comment   WBC 15.7 (*) 4.0 - 10.5 K/uL    RBC 5.12  4.22 - 5.81 MIL/uL    Hemoglobin 13.2  13.0 - 17.0 g/dL    HCT 38.2 (*) 39.0 - 52.0 %    MCV 74.6 (*) 78.0 - 100.0 fL    MCH 25.8 (*) 26.0 - 34.0 pg    MCHC 34.6  30.0 - 36.0 g/dL    RDW 16.2 (*) 11.5 - 15.5 %    Platelets 473 (*) 150 - 400 K/uL   DIFFERENTIAL     Status: Abnormal   Collection Time   09/16/11  5:12 AM      Component Value Range Comment   Neutrophils Relative 80 (*) 43 - 77 %    Neutro Abs 12.6 (*) 1.7 - 7.7 K/uL    Lymphocytes Relative 11 (*) 12 - 46 %    Lymphs Abs 1.8  0.7 - 4.0 K/uL    Monocytes  Relative 8  3 - 12 %    Monocytes Absolute 1.2 (*) 0.1 - 1.0 K/uL    Eosinophils Relative 1  0 - 5 %    Eosinophils Absolute 0.1  0.0 - 0.7 K/uL    Basophils Relative 0  0 - 1 %    Basophils Absolute 0.0  0.0 - 0.1 K/uL   BASIC METABOLIC PANEL     Status: Abnormal   Collection Time   09/16/11  5:12 AM      Component Value Range Comment   Sodium 132 (*) 135 - 145 mEq/L    Potassium 3.3 (*) 3.5 - 5.1 mEq/L    Chloride 94 (*) 96 - 112 mEq/L    CO2 28  19 - 32 mEq/L    Glucose, Bld 119 (*) 70 - 99 mg/dL    BUN 5 (*) 6 - 23 mg/dL    Creatinine, Ser 0.70  0.50 - 1.35 mg/dL    Calcium 9.7  8.4 - 10.5 mg/dL    GFR calc non Af Amer >90  >90 mL/min  GFR calc Af Amer >90  >90 mL/min     Ct Pelvis W Contrast  09/14/2011  *RADIOLOGY REPORT*  Clinical Data:  Evaluate for perirectal abscess.  CT PELVIS WITH CONTRAST  Technique:  Multidetector CT imaging of the pelvis was performed using the standard protocol following the bolus administration of intravenous contrast.  Contrast: 138m OMNIPAQUE IOHEXOL 300 MG/ML  SOLN  Comparison:   None.  Findings:  There is a fluid collection with a faint rim of peripheral enhancement in the subcutaneous soft tissues of the medial left buttocks, likely extending to the skin within the gluteal cleft.  Fluid collection measures 2.2 x 1.9 x 3.3 cm. There is some stranding in the adjacent subcutaneous fat.  No intra-abdominal fluid collection, free fluid, or abscess is identified.  The appendix is seen on images 13-16 and is normal. Urinary bladder and prostate gland are within normal limits.  The visualized bowel loops are normal in caliber. Rectum appears within normal limits.  Reactive size of left pelvic sidewall lymph node measures 6 mm short axis.  No pathologically enlarged lymph nodes.  No acute bony abnormality.  IMPRESSION:  Left perianal abscess measures 2.2 x 1.9 x 3.3 cm.  Original Report Authenticated By: SCurlene Dolphin M.D.   Ct Abdomen Pelvis W  Contrast  09/16/2011  *RADIOLOGY REPORT*  Clinical Data: Rectal pain and nausea; evaluate perirectal abscess.  CT ABDOMEN AND PELVIS WITH CONTRAST  Technique:  Multidetector CT imaging of the abdomen and pelvis was performed following the standard protocol during bolus administration of intravenous contrast.  Contrast: 1074mOMNIPAQUE IOHEXOL 300 MG/ML  SOLN  Comparison: CT of the pelvis performed 09/14/2011  Findings: The visualized lung bases are clear.  The liver and spleen are unremarkable in appearance.  The gallbladder is within normal limits.  The pancreas and adrenal glands are unremarkable.  Scattered hypodensities within both kidneys likely reflects small cysts.  The kidneys are otherwise unremarkable in appearance. There is no evidence of hydronephrosis.  No renal or ureteral stones are seen.  No perinephric stranding is appreciated.  No free fluid is identified.  The small bowel is unremarkable in appearance.  The stomach is within normal limits.  No acute vascular abnormalities are seen.  The appendix is normal in caliber and contains air, without evidence for appendicitis.  The colon is grossly unremarkable in appearance.  At the level of the anorectal canal, note is made of a focus of air apparently outside the canal, with asymmetric left-sided soft tissue inflammation likely reflecting a 5.1 x 2.1 x 4.1 cm phlegmon.  There may be minimal associated fluid, without a defined abscess.  More inferiorly, another focus of air is noted within the left-sided soft tissues with surrounding soft tissue inflammation, raising suspicion for an anorectal fistula.  The bladder is decompressed and not well assessed.  The prostate remains normal in size.  A mildly enlarged 1.4 cm left inguinal node is noted.  No acute osseous abnormalities are identified.  Mild chronic endplate sclerotic changes are seen along the thoracic and mid lumbar spine.  IMPRESSION:  1.  Asymmetric left-sided soft tissue phlegmon noted at the  level of the anorectal canal, measuring approximately 5.1 x 2.1 x 4.1 cm, with suggestion of minimal associated fluid but no evidence of abscess.  Foci of air within the soft tissues raise suspicion for a left-sided anorectal fistula. 2.  Mildly enlarged left inguinal node noted, measuring 1.4 cm in short axis. 3.  Scattered tiny renal cysts seen.  Original Report Authenticated By: Santa Lighter, M.D.    All other review of systems negative or noncontributory except as stated in the HPI  Blood pressure 101/58, pulse 79, temperature 97.8 F (36.6 C), temperature source Oral, resp. rate 12, height 6' 1.5" (1.867 m), weight 246 lb (111.585 kg), SpO2 96.00%. General appearance: alert, cooperative and no distress Head: Normocephalic, without obvious abnormality, atraumatic Resp: clear to auscultation bilaterally Cardio: regular rate and rhythm, S1, S2 normal, no murmur, click, rub or gallop GI: soft, non-tender; bowel sounds normal; no masses,  no organomegaly Skin: Prior I/d site noted in left buttock region.  he has exquisite tenderness in this area with induration and erythema consistent with perirectal abscess Neurologic: Grossly normal  Assessment/Plan: Perirectal abscess Think that this is consistent with a perirectal abscess it looks as though he has a 5 cm fluid collection in the area of tenderness in the left perirectal area. Given his history and physical exam as well as a CT scan of, I recommended formal rectal exam and anesthesia with likely incision and drainage of this abscess. We discussed the risks of procedure including infection, bleeding, pain, scarring, chronic wound and need for wound care, fistula formation, inadequate drainage of and negative exams and possible need for repeat surgery and he expressed understanding and would like to see it with rectal exam under anesthesia and incision and drainage of left perirectal abscess  [place] Pelto DAVID 09/16/2011, 10:53 AM

## 2011-09-16 NOTE — ED Notes (Signed)
Pt c/o an abscess close to his rectum, and had it drained Monday.  Since Monday pt has had pain around rectum.  Reports having another abscess on left side of rectum.

## 2011-09-16 NOTE — ED Notes (Signed)
Pt reports having rectal abscess x1 week. States he was seen at [facility] today, was sent here for surgical drainage. Reports having diarrhea x2 weeks, denies blood in stool. Denies pain medication for abscess.

## 2011-09-16 NOTE — Progress Notes (Signed)
Pt has BP 82/42 T 99.5 P85 R 18 02 98 RA.  Notified MD.  Received new orders.  Will continue to monitor pt. Gunnar [place] 10:03 PM 09/16/2011

## 2011-09-16 NOTE — Brief Op Note (Signed)
09/16/2011  12:10 PM  PATIENT:  Kerry Harrison  41 y.o. male  PRE-OPERATIVE DIAGNOSIS:  abcess  POST-OPERATIVE DIAGNOSIS:  abcess  PROCEDURE:  Procedure(s) (LRB): IRRIGATION AND DEBRIDEMENT PERIRECTAL ABSCESS (N/A)  SURGEON:  Surgeon(s) and Role:    * Madilyn Hook, DO - Primary  PHYSICIAN ASSISTANT:   ASSISTANTS: none   ANESTHESIA:   general  EBL:  Total I/O In: 900 [I.V.:900] Out: 50 [Blood:50]  BLOOD ADMINISTERED:none  DRAINS: Penrose drain in the perirectal area   LOCAL MEDICATIONS USED:  MARCAINE    and LIDOCAINE   SPECIMEN:  No Specimen  DISPOSITION OF SPECIMEN:  N/A  COUNTS:  YES  TOURNIQUET:  * No tourniquets in log *  DICTATION: .Other Dictation: Dictation Number  [id number]  PLAN OF CARE: Admit for overnight observation  PATIENT DISPOSITION:  PACU - hemodynamically stable.   Delay start of Pharmacological VTE agent (>24hrs) due to surgical blood loss or risk of bleeding: no

## 2011-09-17 LAB — WOUND CULTURE: Special Requests: NORMAL

## 2011-09-17 NOTE — Progress Notes (Signed)
1 Day Post-Op  Subjective: Improved from yesterday  Objective: Vital signs in last 24 hours: Temp:  [97.7 F (36.5 C)-99.5 F (37.5 C)] 98.3 F (36.8 C) (06/20 1015) Pulse Rate:  [73-104] 80  (06/20 1015) Resp:  [14-22] 14  (06/20 1015) BP: (82-135)/(42-78) 113/66 mmHg (06/20 1015) SpO2:  [95 %-100 %] 98 % (06/20 1015) Last BM Date: 09/16/11  Intake/Output from previous day: 06/19 0701 - 06/20 0700 In: 2076 [P.O.:720; I.V.:1000; IV Piggyback:356] Out: 1950 [Urine:1900; Blood:50] Intake/Output this shift: Total I/O In: 240 [P.O.:240] Out: -   General appearance: alert, cooperative and no distress Cardio: regular rate and rhythm, S1, S2 normal, no murmur, click, rub or gallop GI: soft, non-tender; bowel sounds normal; no masses,  no organomegaly Skin: wound okay, cellulitis improved.  dressing changed, no purulence.   Lab Results:   [facility] 09/16/11 0512 09/14/11 1744  WBC 15.7* 13.0*  HGB 13.2 12.2*  HCT 38.2* 35.6*  PLT 473* 386   BMET  Basename 09/16/11 0512 09/14/11 1744  NA 132* 131*  K 3.3* 3.9  CL 94* 95*  CO2 28 26  GLUCOSE 119* 90  BUN 5* 7  CREATININE 0.70 0.70  CALCIUM 9.7 9.2   PT/INR No results found for this basename: LABPROT:2,INR:2 in the last 72 hours ABG No results found for this basename: PHART:2,PCO2:2,PO2:2,HCO3:2 in the last 72 hours  Studies/Results: Ct Abdomen Pelvis W Contrast  09/16/2011  *RADIOLOGY REPORT*  Clinical Data: Rectal pain and nausea; evaluate perirectal abscess.  CT ABDOMEN AND PELVIS WITH CONTRAST  Technique:  Multidetector CT imaging of the abdomen and pelvis was performed following the standard protocol during bolus administration of intravenous contrast.  Contrast: 158m OMNIPAQUE IOHEXOL 300 MG/ML  SOLN  Comparison: CT of the pelvis performed 09/14/2011  Findings: The visualized lung bases are clear.  The liver and spleen are unremarkable in appearance.  The gallbladder is within normal limits.  The pancreas and  adrenal glands are unremarkable.  Scattered hypodensities within both kidneys likely reflects small cysts.  The kidneys are otherwise unremarkable in appearance. There is no evidence of hydronephrosis.  No renal or ureteral stones are seen.  No perinephric stranding is appreciated.  No free fluid is identified.  The small bowel is unremarkable in appearance.  The stomach is within normal limits.  No acute vascular abnormalities are seen.  The appendix is normal in caliber and contains air, without evidence for appendicitis.  The colon is grossly unremarkable in appearance.  At the level of the anorectal canal, note is made of a focus of air apparently outside the canal, with asymmetric left-sided soft tissue inflammation likely reflecting a 5.1 x 2.1 x 4.1 cm phlegmon.  There may be minimal associated fluid, without a defined abscess.  More inferiorly, another focus of air is noted within the left-sided soft tissues with surrounding soft tissue inflammation, raising suspicion for an anorectal fistula.  The bladder is decompressed and not well assessed.  The prostate remains normal in size.  A mildly enlarged 1.4 cm left inguinal node is noted.  No acute osseous abnormalities are identified.  Mild chronic endplate sclerotic changes are seen along the thoracic and mid lumbar spine.  IMPRESSION:  1.  Asymmetric left-sided soft tissue phlegmon noted at the level of the anorectal canal, measuring approximately 5.1 x 2.1 x 4.1 cm, with suggestion of minimal associated fluid but no evidence of abscess.  Foci of air within the soft tissues raise suspicion for a left-sided anorectal fistula. 2.  Mildly enlarged left inguinal node noted, measuring 1.4 cm in short axis. 3.  Scattered tiny renal cysts seen.  Original Report Authenticated By: Santa Lighter, M.D.    Anti-infectives: Anti-infectives     Start     Dose/Rate Route Frequency Ordered Stop   09/16/11 1400   metroNIDAZOLE (FLAGYL) IVPB 500 mg        500 mg 100  mL/hr over 60 Minutes Intravenous Every 8 hours 09/16/11 1310     09/16/11 1330   ciprofloxacin (CIPRO) IVPB 400 mg        400 mg 200 mL/hr over 60 Minutes Intravenous Every 12 hours 09/16/11 1310            Assessment/Plan: s/p Procedure(s) (LRB): IRRIGATION AND DEBRIDEMENT PERIRECTAL ABSCESS (N/A) continue abx and wound care.  will see if he can handle dressing changes with po meds.  overall, he feels better but will stay another day to ensure wbc decreasing and pain controlled  LOS: 1 day    [place], [place] 09/17/2011

## 2011-09-17 NOTE — Op Note (Signed)
NAME:  Kerry Harrison, Kerry Harrison                 ACCOUNT NO.:  [id number]  MEDICAL RECORD NO.:  [id number]  LOCATION:  [facility]                         FACILITY:  [facility]  PHYSICIAN:  Madilyn Hook, MD       DATE OF BIRTH:  07-Jul-1970  DATE OF PROCEDURE:  09/16/2011 DATE OF DISCHARGE:  09/16/2011                              OPERATIVE REPORT   PROCEDURE:  Rectal exam under anesthesia with incision and drainage of perirectal abscess.  SURGEON:  Madilyn Hook, MD  ASSISTANT:  None.  ANESTHESIA:  General endotracheal tube anesthesia with 40 cc of 1% lidocaine with epinephrine, 0.25% Marcaine in a 50:50 mixture.  FLUIDS:  Please see Anesthesia record.  ESTIMATED BLOOD LOSS:  Less than 50 cc.  DRAINS:  Penrose drain placed in the perirectal cavity.  SPECIMENS:  None.  COMPLICATIONS:  None apparent.  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  INDICATION FOR PROCEDURE:  Mr. Kerry Harrison is a 41 year old male with a 1-week history of perirectal pain and a 2-week history of diarrhea.  Two days ago he had incision and drainage of the perirectal abscess and he has had persistent pain and symptoms.  OPERATIVE DETAILS:  Mr. Kerry Harrison was seen and evaluated in the preoperative area and risks and benefits of the procedure were discussed in lay terms.  Informed consent was obtained.  He was given therapeutic antibiotics in the emergency room, taken to the operating room, placed on the table in supine position.  General endotracheal tube anesthesia was obtained, and he was flipped in the prone jack-knife position. Buttocks were taped laterally.  His perirectal area was prepped and draped in the standard surgical fashion.  Procedure time-out was performed with all operative team members to confirm proper patient, procedure.  A digital rectal exam was performed which returned  with purulent material.  An anoscope was passed into the anal canal and from one of the left lateral anal crypts he had some purulent material, which was extruded.  I aspirated this anal crypt to evacuate the purulence, I did not palpate any abscess cavity extending more internally.  I probed this internal opening and he did not have any induration or fluctuance internal to this opening.  This appeared to track around the sphincter muscles and communicated internally with the cavity in the ischial rectal space in the buttock region.  I enlarged the previous opening that was done 2 days ago in the emergency room and this tracked towards the internal opening.  Again, I think this tracked around the sphincter muscles.  After I had evacuated all the purulent material and irrigated the wound, I injected it with 40 cc of 1% lidocaine with epinephrine and 0.25% Marcaine in a 50:50 mixture and I decided that it would not be wise to open up this entire tract, which involved the sphincter muscles. I decided to place a Penrose drain into the cavity and this was sutured in place.  A 0.25 -inch Penrose drain was placed into the cavity and sutured in place with a 3-0 nylon drain stitch.  Then the remainder of the  external opening was packed with 0.25 inch NuGauze and a Gelfoam packing coated in 1% lidocaine with epinephrine was placed in the anal canal for hemostasis.  All sponge, needle, and instrument counts were correct at the end of the case.  The patient tolerated the procedure well without apparent complication.  I explained to the patient and his family members that there would be a reasonable chance that he developed a fistula postoperatively, which would likely need future treatment.  He was stable and ready for transfer to the recovery room.          ______________________________ Madilyn Hook, MD     BL/MEDQ  D:  09/16/2011  T:  09/17/2011  Job:  [id number]

## 2011-09-18 ENCOUNTER — Encounter (HOSPITAL_COMMUNITY): Payer: Self-pay | Admitting: General Surgery

## 2011-09-18 LAB — DIFFERENTIAL
Basophils Absolute: 0 10*3/uL (ref 0.0–0.1)
Lymphocytes Relative: 17 % (ref 12–46)
Lymphs Abs: 2.3 10*3/uL (ref 0.7–4.0)
Monocytes Absolute: 1.2 10*3/uL — ABNORMAL HIGH (ref 0.1–1.0)
Monocytes Relative: 9 % (ref 3–12)
Neutro Abs: 9.8 10*3/uL — ABNORMAL HIGH (ref 1.7–7.7)

## 2011-09-18 LAB — CBC
HCT: 32.8 % — ABNORMAL LOW (ref 39.0–52.0)
Hemoglobin: 10.6 g/dL — ABNORMAL LOW (ref 13.0–17.0)
MCV: 77.2 fL — ABNORMAL LOW (ref 78.0–100.0)
RBC: 4.25 MIL/uL (ref 4.22–5.81)
RDW: 16.1 % — ABNORMAL HIGH (ref 11.5–15.5)
WBC: 13.4 10*3/uL — ABNORMAL HIGH (ref 4.0–10.5)

## 2011-09-18 MED ORDER — CIPROFLOXACIN HCL 500 MG PO TABS: 500.0000 mg | ORAL_TABLET | Freq: Two times a day (BID) | ORAL | Status: AC

## 2011-09-18 MED ORDER — ACETAMINOPHEN 325 MG PO TABS: 650.0000 mg | ORAL_TABLET | Freq: Four times a day (QID) | ORAL | Status: DC | PRN

## 2011-09-18 MED ORDER — METRONIDAZOLE 500 MG PO TABS
500.0000 mg | ORAL_TABLET | Freq: Three times a day (TID) | ORAL | Status: DC
  Administered 2011-09-18: 500 mg via ORAL
  Filled 2011-09-18 (×3): qty 1

## 2011-09-18 MED ORDER — CIPROFLOXACIN HCL 500 MG PO TABS
500.0000 mg | ORAL_TABLET | Freq: Two times a day (BID) | ORAL | Status: DC
Start: 2011-09-18 — End: 2011-09-18
  Filled 2011-09-18 (×2): qty 1

## 2011-09-18 MED ORDER — METRONIDAZOLE 500 MG PO TABS: 500.0000 mg | ORAL_TABLET | Freq: Three times a day (TID) | ORAL | Status: AC

## 2011-09-18 MED ORDER — OXYCODONE-ACETAMINOPHEN 5-325 MG PO TABS: 2.0000 | ORAL_TABLET | ORAL | Status: AC | PRN

## 2011-09-18 NOTE — Progress Notes (Signed)
Pt given d/c instructions with wife at bedside using Teach Back including meds, f/u appts, dressing changes, when to call MD--verbalized understanding.  Theodoro Grist PA-C removed penrose drain at bedside.  Pt tolerated well.  Belonging packed, prescriptions provided.  Stable for discharge home.

## 2011-09-18 NOTE — Progress Notes (Cosign Needed)
2 Days Post-Op  Subjective: He is feeling better, still pretty sore.  He's had the dressing changed once today, and I just did it again.  The drain flops out but can easily be place back into open area.  Objective: Vital signs in last 24 hours: Temp:  [97.6 F (36.4 C)-99.6 F (37.6 C)] 97.6 F (36.4 C) (06/21 0651) Pulse Rate:  [69-98] 69  (06/21 0651) Resp:  [15-18] 18  (06/21 0651) BP: (106-121)/(58-72) 112/68 mmHg (06/21 0651) SpO2:  [96 %-98 %] 97 % (06/21 0651) Last BM Date: 09/18/11 +BM, afebrile, VSS, WBC is still up  Intake/Output from previous day: 06/20 0701 - 06/21 0700 In: 720 [P.O.:720] Out: 475 [Urine:475] Intake/Output this shift:    General appearance: alert, cooperative and no distress Incision/Wound: Open and clean, no significant drainage when I saw him.      Lab Results:   Basename 09/18/11 0355 09/16/11 0512  WBC 13.4* 15.7*  HGB 10.6* 13.2  HCT 32.8* 38.2*  PLT 373 473*    BMET  Basename 09/16/11 0512  NA 132*  K 3.3*  CL 94*  CO2 28  GLUCOSE 119*  BUN 5*  CREATININE 0.70  CALCIUM 9.7   PT/INR No results found for this basename: LABPROT:2,INR:2 in the last 72 hours  No results found for this basename: AST:5,ALT:5,ALKPHOS:5,BILITOT:5,PROT:5,ALBUMIN:5 in the last 168 hours   Lipase  No results found for this basename: lipase     Studies/Results: No results found.  Medications:    . ciprofloxacin  400 mg Intravenous Q12H  . enoxaparin  40 mg Subcutaneous Q24H  . metronidazole  500 mg Intravenous Q8H  . nortriptyline  10 mg Oral QHS    Assessment/Plan Perirectal abscess, with drain  Plan:  I think he can go home with dressing changes and antibiotics.  I will check with DR. Layton on the penrose drain.  change to PO antibiotics at discharge.     LOS: 2 days    Holiday Mcmenamin 09/18/2011

## 2011-09-18 NOTE — Discharge Summary (Cosign Needed)
Physician Discharge Summary  Patient ID: Kerry Harrison. Kerry Harrison MRN: [id number] DOB/AGE: [birth year] 41 y.o.  Admit date: 09/16/2011 Discharge date: 09/18/2011  Admission Diagnoses: Perirectal abscess .  Arthritis  .  Monoclonal gammopathy    Discharge Diagnoses: Same Active Problems:  * No active hospital problems. *    PROCEDURES: IRRIGATION AND DEBRIDEMENT PERIRECTAL ABSCESS 09/16/2011 Madilyn Hook, [facility] Course: this patient presents for evaluation of perirectal abscess. He has had increasing pain for the last 4-5 days in the perirectal area and he was seen previously in the emergency room approximately 2 days ago where he had an incision and drainage of. He was started on Bactrim at that time and has been on it since it however, since then has had increasing pain in the area is at extreme pain with bowel movements. Is swelling in the area of and has had 2 weeks of diarrhea as well. He denies any fevers or chills he does have a history of previous buttock abscess which required incision and drainage. He underwent I/D of abscess and was transferred to the floor.  He has been maintained on IV antibiotics.  The first post op day he was still very tender, and could not handle dressing change without IV pain med.  Today, he's doing much better.  He wanted to go home on the afternoon of 09/18/11. He will continue wet to dry dressing, his wife has seen me do them.  He can sitz bath/shower BID or more. Follow up in two weeks with Dr. Lilyan Punt.  Condition on D/C:  Improved  Disposition: 01-Home or Self Care   Medication List  As of 09/18/2011  1:40 PM   ASK your doctor about these medications         nortriptyline 10 MG capsule   Commonly known as: PAMELOR   Take 10 mg by mouth at bedtime.      oxyCODONE-acetaminophen 5-325 MG per tablet   Commonly known as: PERCOCET   Take 2 tablets by mouth once.      sulfamethoxazole-trimethoprim 800-160 MG per tablet   Commonly known as: BACTRIM DS     Take 1 tablet by mouth 2 (two) times daily.      traMADol 50 MG tablet   Commonly known as: ULTRAM   Take 50 mg by mouth every 6 (six) hours as needed. Patient used this medication for pain.             SignedEarnstine Regal 09/18/2011, 1:40 PM

## 2011-09-18 NOTE — Discharge Instructions (Signed)
Peri-Rectal Abscess Your caregiver has diagnosed you as having a peri-rectal abscess. This is an infected area near the rectum that is filled with pus. If the abscess is near the surface of the skin, your caregiver may open (incise) the area and drain the pus. HOME CARE INSTRUCTIONS   If your abscess was opened up and drained. A small piece of gauze may be placed in the opening so that it can drain. Do not remove the gauze unless directed by your caregiver.   A loose dressing may be placed over the abscess site. Change the dressing as often as necessary to keep it clean and dry.   After the drain is removed, the area may be washed with a gentle antiseptic (soap) four times per day.   A warm sitz bath, warm packs or heating pad may be used for pain relief, taking care not to burn yourself.   Return for a wound check in 1 day or as directed.   An "inflatable doughnut" may be used for sitting with added comfort. These can be purchased at a drugstore or medical supply house.   To reduce pain and straining with bowel movements, eat a high fiber diet with plenty of fruits and vegetables. Use stool softeners as recommended by your caregiver. This is especially important if narcotic type pain medications were prescribed as these may cause marked constipation.   Only take over-the-counter or prescription medicines for pain, discomfort, or fever as directed by your caregiver.  SEEK IMMEDIATE MEDICAL CARE IF:   You have increasing pain that is not controlled by medication.   There is increased inflammation (redness), swelling, bleeding, or drainage from the area.   An oral temperature above 102 F (38.9 C) develops.   You develop chills or generalized malaise (feel lethargic or feel "washed out").   You develop any new symptoms (problems) you feel may be related to your present problem.  Document Released: 03/13/2000 Document Revised: 03/05/2011 Document Reviewed: 03/13/2008 [facility] Patient  Information 2012 [place].

## 2011-10-09 ENCOUNTER — Encounter (INDEPENDENT_AMBULATORY_CARE_PROVIDER_SITE_OTHER): Payer: Self-pay

## 2011-10-09 ENCOUNTER — Encounter (INDEPENDENT_AMBULATORY_CARE_PROVIDER_SITE_OTHER): Payer: Self-pay | Admitting: General Surgery

## 2011-10-09 ENCOUNTER — Ambulatory Visit (INDEPENDENT_AMBULATORY_CARE_PROVIDER_SITE_OTHER): Payer: BC Managed Care – PPO | Admitting: General Surgery

## 2011-10-09 VITALS — BP 104/70 | HR 80 | Temp 98.0°F | Ht 73.5 in | Wt 256.2 lb

## 2011-10-09 DIAGNOSIS — Z4889 Encounter for other specified surgical aftercare: Secondary | ICD-10-CM

## 2011-10-09 DIAGNOSIS — Z5189 Encounter for other specified aftercare: Secondary | ICD-10-CM

## 2011-10-09 NOTE — Progress Notes (Signed)
Subjective:     Patient ID: Kerry Harrison, male   DOB: 11-Jun-1970, 41 y.o.   MRN: [id number]  HPI This patient follows up status post incision and drainage of a left perirectal abscess. He says that since his procedure he has been doing much better. His Penrose drain fell out and also and he his wife has been helping with the wound care and wound packing. He states that the tenderness is greatly improved. He has no complaints and has completed his antibiotic therapy and denies any residual fevers or chills but he does have some occasional drainage.  Review of Systems     Objective:   Physical Exam No acute distress and nontoxic-appearing The incision is almost completely healed. There is a small 1 cm linear area of granulation tissue which is flush with the skin and I treated this with a single application of silver nitrate. There is no evidence of fistula formation and no evidence of cellulitis or residual fluctuance. The area is nontender on exam.    Assessment:     Status post incision and drainage of perirectal abscess-doing well I think that this is almost completely healed and will continue to heal over the next one to 2 weeks. He does not need to do any wound packing but just topical dressing to take care of any drainage if present. I would like to see him back in 2 weeks if needed if this wound is now completely healed but otherwise if this is healed he should not need any other further followup unless this develops into a fistula. We did discuss this possibility of preoperatively and today and he will follow up if he has persistent drainage.    Plan:     We'll see him back in 2 weeks as needed if this is not completely healed.

## 2012-03-30 DIAGNOSIS — M7121 Synovial cyst of popliteal space [Baker], right knee: Secondary | ICD-10-CM

## 2012-03-30 HISTORY — DX: Synovial cyst of popliteal space (Baker), right knee: M71.21

## 2012-04-12 ENCOUNTER — Ambulatory Visit (HOSPITAL_BASED_OUTPATIENT_CLINIC_OR_DEPARTMENT_OTHER)
Admission: RE | Admit: 2012-04-12 | Discharge: 2012-04-12 | Disposition: A | Discharge status: Home / Self Care | Payer: BC Managed Care – PPO | Source: Ambulatory Visit | Attending: Internal Medicine | Admitting: Internal Medicine

## 2012-04-12 ENCOUNTER — Other Ambulatory Visit (HOSPITAL_BASED_OUTPATIENT_CLINIC_OR_DEPARTMENT_OTHER): Payer: Self-pay | Admitting: Internal Medicine

## 2012-04-12 DIAGNOSIS — M79609 Pain in unspecified limb: Secondary | ICD-10-CM | POA: Insufficient documentation

## 2012-04-12 DIAGNOSIS — M7989 Other specified soft tissue disorders: Secondary | ICD-10-CM

## 2012-04-12 DIAGNOSIS — M79606 Pain in leg, unspecified: Secondary | ICD-10-CM

## 2012-04-20 ENCOUNTER — Encounter (INDEPENDENT_AMBULATORY_CARE_PROVIDER_SITE_OTHER): Payer: BC Managed Care – PPO | Admitting: General Surgery

## 2012-05-05 ENCOUNTER — Encounter (INDEPENDENT_AMBULATORY_CARE_PROVIDER_SITE_OTHER): Payer: Self-pay | Admitting: General Surgery

## 2012-05-05 ENCOUNTER — Ambulatory Visit (INDEPENDENT_AMBULATORY_CARE_PROVIDER_SITE_OTHER): Payer: BC Managed Care – PPO | Admitting: General Surgery

## 2012-05-05 VITALS — BP 128/76 | HR 74 | Temp 97.8°F | Resp 18 | Ht 73.5 in | Wt 290.0 lb

## 2012-05-05 DIAGNOSIS — K603 Anal fistula: Secondary | ICD-10-CM

## 2012-05-05 NOTE — Progress Notes (Signed)
Subjective:     Patient ID: Kerry Harrison, male   DOB: October 09, 1970, 42 y.o.   MRN: [id number]  HPI This patient is known to me for prior incision and drainage of a perirectal abscess back in September. His wound had never completely healed. She has had some intermittent drainage from the area of his incision and drainage site in the left buttock region. He says he has some bloody discharge and some discomfort in the area. He says every time he wipes after moving his bowels he will noticed that the areas moist and shortly thereafter due to the drainage.  Review of Systems     Objective:   Physical Exam No distress and nontoxic-appearing He appears to have a small external areas of granulation tissue at the left lateral position and the area of his prior incision drainage site with a scant amount of purulent drainage. 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 It has been several months in think that he will likely need surgical treatment for this anal fistula. Discussed with him the options for fistulotomy and rectal exam under anesthesia and we will go ahead and set him up for evaluation by Dr. Marcello Moores    Plan:     I will refer her to Dr. Marcello Moores for evaluation of his anal fistula.

## 2012-05-12 ENCOUNTER — Encounter (INDEPENDENT_AMBULATORY_CARE_PROVIDER_SITE_OTHER): Payer: BC Managed Care – PPO | Admitting: General Surgery

## 2012-05-13 ENCOUNTER — Ambulatory Visit (INDEPENDENT_AMBULATORY_CARE_PROVIDER_SITE_OTHER): Payer: BC Managed Care – PPO | Admitting: General Surgery

## 2012-05-26 ENCOUNTER — Encounter (INDEPENDENT_AMBULATORY_CARE_PROVIDER_SITE_OTHER): Payer: Self-pay | Admitting: General Surgery

## 2012-05-26 ENCOUNTER — Ambulatory Visit (INDEPENDENT_AMBULATORY_CARE_PROVIDER_SITE_OTHER): Payer: BC Managed Care – PPO | Admitting: General Surgery

## 2012-05-26 VITALS — BP 116/78 | HR 72 | Temp 98.8°F | Resp 18 | Ht 73.5 in | Wt 292.4 lb

## 2012-05-26 DIAGNOSIS — K6289 Other specified diseases of anus and rectum: Secondary | ICD-10-CM | POA: Insufficient documentation

## 2012-05-26 DIAGNOSIS — K605 Anorectal fistula, unspecified: Secondary | ICD-10-CM

## 2012-05-26 DIAGNOSIS — K603 Anal fistula: Secondary | ICD-10-CM

## 2012-05-26 NOTE — Progress Notes (Signed)
Chief Complaint  Patient presents with  . New Evaluation    Anal fistula    HISTORY: Kerry Harrison is a 42 y.o. male who presents to the office with anorectal fistula.  He is s/p abscess drainage in June of last year.  Since then, he has continued to have external drainage.  This is sometimes bloody ans sometimes clear.  He denies fevers, chills or pain with defecation.    Past Medical History  Diagnosis Date  . Arthritis   . Monoclonal gammopathy   . Anal fissure       Past Surgical History  Procedure Laterality Date  . Incision and drainage perirectal abscess  09/16/2011    Procedure: IRRIGATION AND DEBRIDEMENT PERIRECTAL ABSCESS;  Surgeon: Madilyn Hook, DO;  Location: [facility];  Service: General;  Laterality: N/A;        Current Outpatient Prescriptions  Medication Sig Dispense Refill  . etodolac (LODINE) 400 MG tablet Take 400 mg by mouth 2 (two) times daily.      Marland Kitchen DEPO-TESTOSTERONE 100 MG/ML injection as needed.      . nortriptyline (PAMELOR) 10 MG capsule Take 10 mg by mouth at bedtime.       No current facility-administered medications for this visit.      No Known Allergies    Family History  Problem Relation Age of Onset  . Cancer Father     lung    History   Social History  . Marital Status: Married    Spouse Name: N/A    Number of Children: N/A  . Years of Education: N/A   Social History Main Topics  . Smoking status: Former Smoker    Quit date: 07/10/2011  . Smokeless tobacco: Never Used  . Alcohol Use: No  . Drug Use: No  . Sexually Active: Not Currently   Other Topics Concern  . None   Social History Narrative  . None      REVIEW OF SYSTEMS - PERTINENT POSITIVES ONLY: Review of Systems - General ROS: negative for - chills, fever or weight loss Hematological and Lymphatic ROS: negative for - bleeding problems, blood clots or bruising Respiratory ROS: no cough, shortness of breath, or wheezing Cardiovascular ROS: no chest pain or dyspnea on  exertion Gastrointestinal ROS: no abdominal pain, change in bowel habits, or black or bloody stools Genito-Urinary ROS: no dysuria, trouble voiding, or hematuria  EXAM: Filed Vitals:   05/26/12 1559  BP: 116/78  Pulse: 72  Temp: 98.8 F (37.1 C)  Resp: 18    General appearance: alert and cooperative Resp: clear to auscultation bilaterally Cardio: regular rate and rhythm GI: soft, non-tender; bowel sounds normal; no masses,  no organomegaly Anal Exam Findings: ext opening in L ant position, no definite internal opening palpated.      ASSESSMENT AND PLAN: Kerry Harrison is a 42 y.o. M with what appears to be an anorectal fistula.  On exam I can visualize an external opening but can't fully appreciate an internal opening.  I have recommended that he undergo anal EUA with either fistulotomy or seton placement.  We discussed the risks of the surgery.  If he were to have a fistulotomy, there would be a slight risk of incontinence.  Other risks are bleeding, pain and recurrence.  I believe he understands these risks and has agreed to proceed.     Rosario Adie, MD Colon and Rectal Surgery / [facility]  Visit Diagnoses: 1. Anorectal fistula     Primary Care Physician: Benito Mccreedy, MD

## 2012-05-26 NOTE — Patient Instructions (Signed)
Anal Abscess/Fistula  What is an anal abscess? An anal abscess is an infected cavity filled with pus found near the anus or rectum. What is an anal fistula? An anal fistula (also called fistula-in-ano) is frequently the result of a previous or current anal abscess, occurring in up to 50% of patients with abscesses. Normal anatomy includes small glands just inside the anus. Occasionally, these glands get clogged and potentially can become infected, leading to an abscess. The fistula is a tunnel that forms under the skin and connects the infected glands to the abscess. A fistula can be present with or without an abscess and may connect just to the skin of the buttocks near the anal opening. Other situations that can result in a fistula include Crohn's disease, radiation, trauma and malignancy. How does someone get an anal abscess or a fistula? The abscess is most often a result of an acute infection in the internal glands of the anus. Occasionally, bacteria, fecal material or foreign matter can clog the anal gland and create a condition for an abscess cavity to form. Other medical conditions can make these types of infections more likely.  After an abscess drains on its own or has been drained (opened), a tunnel (fistula) may persist, connecting the infected anal gland to the external skin. This typically will involve some type of drainage from the external opening and occurs in up to 50% of abscesses. If the opening on the skin heals when a fistula is present, a recurrent abscess may develop. What are the specific signs or symptoms of an abscess or fistula? A patient with an abscess may have pain, redness or swelling in the area around the anal area. Fatigue, general malaise, as well as accompanying fever or chills are also common. Similar signs and symptoms may be present when patients have a fistula, with the addition of possible irritation of the perianal skin or drainage from an external opening. Is  any specific testing necessary to diagnose an abscess or fistula? No. Most anal abscesses or fistula-in-ano are diagnosed and managed on the basis of clinical findings. Occasionally, additional studies such as ultrasound, CT scan, or MRI can assist with the diagnosis of deeper abscesses or the delineation of the fistula tunnel to help guide treatment.  What is the treatment of an anal abscess? The treatment of an abscess is surgical drainage under most circumstances. An incision is made in the skin near the anus to drain the infection. This can be done in a doctor's office with local anesthetic or in an operating room under deeper anesthesia. Hospitalization may be required for patients prone to more significant infections such as diabetics or patients with decreased immunity. Are antibiotics required to treat this type of infection? Antibiotics alone are a poor alternative to drainage of the infection. For uncomplicated abscesses, the addition of antibiotics to surgical drainage does not improve healing time or reduce the potential for recurrences. There are some conditions in which antibiotics are indicated, such as for patients with compromised or altered immunity, some cardiac valvular conditions or extensive cellulitis. A comprehensive discussion of your past medical history and a physical exam are important to determine if antibiotics are indicated. What is the treatment of an anal fistula? Surgery is almost always necessary to cure an anal fistula. Although surgery can be fairly straightforward, it may also be complicated, occasionally requiring staged or multiple operations. Consider identifying a specialist in colon and rectal surgery who would be familiar with a number of potential operations  to treat the fistula. The surgery may be performed at the same time as drainage of an abscess, although sometimes the fistula doesn't appear until weeks to years after the initial drainage. If the fistula is  straightforward, a fistulotomy may be performed. This procedure involves connecting the internal opening within the anal canal to the external opening, creating a groove that will heal from the inside out. This surgery often will require dividing a small portion of the sphincter muscle which has the unlikely potential for affecting the control of bowel movements in a limited number of cases.  Other procedures include placing material within the fistula tract to occlude it or surgically altering the surrounding tissue to accomplish closure of the fistula, with the choice of procedure depending upon the type, length, and location of the fistula. Most of the operations can be performed on an outpatient basis, but may occasionally require hospitalization. What is the recovery like from surgery? Pain after surgery is controlled with pain pills, fiber and bulk laxatives. Patients should plan for time at home using sitz baths and attempt to avoid the constipation that can be associated with prescription pain medication. Discuss with your surgeon the specific care and time away from work prior to surgery to prepare yourself for post-operative care. Can the abscess or fistula recur? If adequately treated and properly healed, both are unlikely to return. However, despite proper and indicated open or minimally invasive treatment, both abscesses and fistulas can potentially recur. Should similar symptoms arise, suggesting recurrence, it is recommended that you find a colon and rectal surgeon to manage your condition. What is a colon and rectal surgeon? Colon and rectal surgeons are experts in the surgical and non-surgical treatment of diseases of the colon, rectum and anus. They have completed advanced surgical training in the treatment of these diseases as well as full general surgical training. Board-certified colon and rectal surgeons complete residencies in general surgery and colon and rectal surgery, and pass  intensive examinations conducted by the American Board of Surgery and the American Board of Colon and Rectal Surgery. They are well-versed in the treatment of both benign and malignant diseases of the colon, rectum and anus and are able to perform routine screening examinations and surgically treat conditions if indicated to do so.  author: Brien Mates, MD, FACS, FASCRS, on behalf of the [place]  2012 American Society of Colon & Rectal Surgeons

## 2012-06-23 ENCOUNTER — Encounter (HOSPITAL_BASED_OUTPATIENT_CLINIC_OR_DEPARTMENT_OTHER): Payer: Self-pay | Admitting: *Deleted

## 2012-06-27 ENCOUNTER — Encounter (HOSPITAL_BASED_OUTPATIENT_CLINIC_OR_DEPARTMENT_OTHER): Payer: Self-pay | Admitting: *Deleted

## 2012-06-27 NOTE — Progress Notes (Signed)
NPO AFTER MN. ARRIVES AT 0930. NEEDS HG.

## 2012-06-30 ENCOUNTER — Encounter (HOSPITAL_BASED_OUTPATIENT_CLINIC_OR_DEPARTMENT_OTHER)
Admission: RE | Disposition: A | Discharge status: Home / Self Care | Payer: Self-pay | Source: Ambulatory Visit | Attending: General Surgery

## 2012-06-30 ENCOUNTER — Encounter (HOSPITAL_BASED_OUTPATIENT_CLINIC_OR_DEPARTMENT_OTHER): Payer: Self-pay | Admitting: *Deleted

## 2012-06-30 ENCOUNTER — Ambulatory Visit (HOSPITAL_BASED_OUTPATIENT_CLINIC_OR_DEPARTMENT_OTHER)
Admission: RE | Admit: 2012-06-30 | Discharge: 2012-06-30 | Disposition: A | Discharge status: Home / Self Care | Payer: BC Managed Care – PPO | Source: Ambulatory Visit | Attending: General Surgery | Admitting: General Surgery

## 2012-06-30 ENCOUNTER — Encounter (HOSPITAL_BASED_OUTPATIENT_CLINIC_OR_DEPARTMENT_OTHER): Payer: Self-pay | Admitting: Anesthesiology

## 2012-06-30 ENCOUNTER — Ambulatory Visit (HOSPITAL_BASED_OUTPATIENT_CLINIC_OR_DEPARTMENT_OTHER): Payer: BC Managed Care – PPO | Admitting: Anesthesiology

## 2012-06-30 DIAGNOSIS — D472 Monoclonal gammopathy: Secondary | ICD-10-CM | POA: Insufficient documentation

## 2012-06-30 DIAGNOSIS — M129 Arthropathy, unspecified: Secondary | ICD-10-CM | POA: Insufficient documentation

## 2012-06-30 DIAGNOSIS — K603 Anal fistula, unspecified: Secondary | ICD-10-CM | POA: Insufficient documentation

## 2012-06-30 HISTORY — PX: EVALUATION UNDER ANESTHESIA WITH ANAL FISTULECTOMY: SHX5621

## 2012-06-30 HISTORY — DX: Anorectal fistula: K60.5

## 2012-06-30 HISTORY — DX: Anorectal fistula, unspecified: K60.50

## 2012-06-30 SURGERY — EXAM UNDER ANESTHESIA WITH ANAL FISTULECTOMY
Anesthesia: Monitor Anesthesia Care | Site: Rectum | Wound class: Clean Contaminated

## 2012-06-30 MED ORDER — PROMETHAZINE HCL 25 MG/ML IJ SOLN
6.2500 mg | INTRAMUSCULAR | Status: DC | PRN
  Filled 2012-06-30: qty 1

## 2012-06-30 MED ORDER — ACETAMINOPHEN 325 MG PO TABS
650.0000 mg | ORAL_TABLET | ORAL | Status: DC | PRN
  Filled 2012-06-30: qty 2

## 2012-06-30 MED ORDER — LACTATED RINGERS IV SOLN
INTRAVENOUS | Status: DC
  Filled 2012-06-30: qty 1000

## 2012-06-30 MED ORDER — DOCUSATE SODIUM 100 MG PO CAPS: 100.0000 mg | ORAL_CAPSULE | Freq: Two times a day (BID) | ORAL | Status: DC

## 2012-06-30 MED ORDER — HYDROCODONE-ACETAMINOPHEN 5-325 MG PO TABS: 1.0000 | ORAL_TABLET | Freq: Four times a day (QID) | ORAL | Status: DC | PRN

## 2012-06-30 MED ORDER — ONDANSETRON HCL 4 MG/2ML IJ SOLN
INTRAMUSCULAR | Status: DC | PRN
  Administered 2012-06-30: 4 mg via INTRAVENOUS

## 2012-06-30 MED ORDER — DEXAMETHASONE SODIUM PHOSPHATE 4 MG/ML IJ SOLN
INTRAMUSCULAR | Status: DC | PRN
  Administered 2012-06-30: 10 mg via INTRAVENOUS

## 2012-06-30 MED ORDER — SODIUM CHLORIDE 0.9 % IJ SOLN
3.0000 mL | INTRAMUSCULAR | Status: DC | PRN
  Filled 2012-06-30: qty 3

## 2012-06-30 MED ORDER — LIDOCAINE 5 % EX OINT
TOPICAL_OINTMENT | CUTANEOUS | Status: DC | PRN
  Administered 2012-06-30: 1

## 2012-06-30 MED ORDER — ONDANSETRON HCL 4 MG/2ML IJ SOLN
4.0000 mg | Freq: Four times a day (QID) | INTRAMUSCULAR | Status: DC | PRN
  Filled 2012-06-30: qty 2

## 2012-06-30 MED ORDER — BUPIVACAINE-EPINEPHRINE 0.25% -1:200000 IJ SOLN
INTRAMUSCULAR | Status: DC | PRN
  Administered 2012-06-30: 26 mL

## 2012-06-30 MED ORDER — FENTANYL CITRATE 0.05 MG/ML IJ SOLN
INTRAMUSCULAR | Status: DC | PRN
  Administered 2012-06-30: 50 ug via INTRAVENOUS
  Administered 2012-06-30: 75 ug via INTRAVENOUS

## 2012-06-30 MED ORDER — HYDROMORPHONE HCL PF 1 MG/ML IJ SOLN
0.2500 mg | INTRAMUSCULAR | Status: DC | PRN
  Filled 2012-06-30: qty 1

## 2012-06-30 MED ORDER — PROPOFOL 10 MG/ML IV EMUL
INTRAVENOUS | Status: DC | PRN
  Administered 2012-06-30: 50 ug/kg/min via INTRAVENOUS

## 2012-06-30 MED ORDER — SODIUM CHLORIDE 0.9 % IJ SOLN
3.0000 mL | Freq: Two times a day (BID) | INTRAMUSCULAR | Status: DC
  Filled 2012-06-30: qty 3

## 2012-06-30 MED ORDER — PROPOFOL 10 MG/ML IV BOLUS
INTRAVENOUS | Status: DC | PRN
  Administered 2012-06-30 (×2): 30 mg via INTRAVENOUS

## 2012-06-30 MED ORDER — LACTATED RINGERS IV SOLN
INTRAVENOUS | Status: DC
  Administered 2012-06-30: 10:00:00 via INTRAVENOUS
  Filled 2012-06-30: qty 1000

## 2012-06-30 MED ORDER — SODIUM CHLORIDE 0.9 % IV SOLN
250.0000 mL | INTRAVENOUS | Status: DC | PRN
  Filled 2012-06-30: qty 250

## 2012-06-30 MED ORDER — ACETAMINOPHEN 650 MG RE SUPP
650.0000 mg | RECTAL | Status: DC | PRN
  Filled 2012-06-30: qty 1

## 2012-06-30 MED ORDER — MIDAZOLAM HCL 5 MG/5ML IJ SOLN
INTRAMUSCULAR | Status: DC | PRN
  Administered 2012-06-30 (×2): 2 mg via INTRAVENOUS

## 2012-06-30 MED ORDER — LIDOCAINE HCL (CARDIAC) 20 MG/ML IV SOLN
INTRAVENOUS | Status: DC | PRN
  Administered 2012-06-30: 50 mg via INTRAVENOUS

## 2012-06-30 SURGICAL SUPPLY — 39 items
APL SKNCLS STERI-STRIP NONHPOA (GAUZE/BANDAGES/DRESSINGS) ×1
BENZOIN TINCTURE PRP APPL 2/3 (GAUZE/BANDAGES/DRESSINGS) ×2 IMPLANT
BLADE HEX COATED 2.75 (ELECTRODE) ×2 IMPLANT
BLADE SURG 15 STRL LF DISP TIS (BLADE) ×1 IMPLANT
BLADE SURG 15 STRL SS (BLADE) ×2
CANISTER SUCTION 2500CC (MISCELLANEOUS) ×2 IMPLANT
CLOTH BEACON ORANGE TIMEOUT ST (SAFETY) ×2 IMPLANT
COVER MAYO STAND STRL (DRAPES) ×2 IMPLANT
COVER TABLE BACK 60X90 (DRAPES) ×2 IMPLANT
DRAPE PED LAPAROTOMY (DRAPES) ×2 IMPLANT
DRAPE UNDERBUTTOCKS STRL (DRAPE) IMPLANT
DRSG PAD ABDOMINAL 8X10 ST (GAUZE/BANDAGES/DRESSINGS) ×2 IMPLANT
ELECT BLADE 6.5 .24CM SHAFT (ELECTRODE) ×1 IMPLANT
ELECT REM PT RETURN 9FT ADLT (ELECTROSURGICAL) ×2
ELECTRODE REM PT RTRN 9FT ADLT (ELECTROSURGICAL) ×1 IMPLANT
GAUZE SPONGE 4X4 16PLY XRAY LF (GAUZE/BANDAGES/DRESSINGS) IMPLANT
GAUZE VASELINE 3X9 (GAUZE/BANDAGES/DRESSINGS) IMPLANT
GLOVE BIO SURGEON STRL SZ 6.5 (GLOVE) ×5 IMPLANT
GLOVE ECLIPSE 6.5 STRL STRAW (GLOVE) ×1 IMPLANT
GLOVE INDICATOR 7.0 STRL GRN (GLOVE) ×4 IMPLANT
GOWN PREVENTION PLUS LG XLONG (DISPOSABLE) ×2 IMPLANT
NDL HYPO 25X1 1.5 SAFETY (NEEDLE) ×1 IMPLANT
NEEDLE HYPO 25X1 1.5 SAFETY (NEEDLE) ×2 IMPLANT
NS IRRIG 500ML POUR BTL (IV SOLUTION) ×2 IMPLANT
PACK BASIN DAY SURGERY FS (CUSTOM PROCEDURE TRAY) ×2 IMPLANT
PENCIL BUTTON HOLSTER BLD 10FT (ELECTRODE) ×2 IMPLANT
SPONGE GAUZE 4X4 12PLY (GAUZE/BANDAGES/DRESSINGS) IMPLANT
SPONGE SURGIFOAM ABS GEL 12-7 (HEMOSTASIS) IMPLANT
SUT CHROMIC 2 0 SH (SUTURE) ×1 IMPLANT
SUT CHROMIC 3 0 SH 27 (SUTURE) ×1 IMPLANT
SUT MON AB 3-0 SH 27 (SUTURE) ×2
SUT MON AB 3-0 SH27 (SUTURE) ×1 IMPLANT
SUT VIC AB 4-0 P-3 18XBRD (SUTURE) IMPLANT
SUT VIC AB 4-0 P3 18 (SUTURE)
SYR CONTROL 10ML LL (SYRINGE) ×2 IMPLANT
TOWEL OR 17X24 6PK STRL BLUE (TOWEL DISPOSABLE) ×3 IMPLANT
TRAY DSU PREP LF (CUSTOM PROCEDURE TRAY) ×2 IMPLANT
TUBE CONNECTING 12X1/4 (SUCTIONS) ×2 IMPLANT
YANKAUER SUCT BULB TIP NO VENT (SUCTIONS) ×2 IMPLANT

## 2012-06-30 NOTE — Op Note (Signed)
06/30/2012  10:43 AM  PATIENT:  Kerry Harrison  42 y.o. male  Patient Care Team: Benito Mccreedy, MD as PCP - General (Internal Medicine)  PRE-OPERATIVE DIAGNOSIS:  anal fistula  POST-OPERATIVE DIAGNOSIS:  anal fistula  PROCEDURE:  Procedure(s): EXAM UNDER ANESTHESIA,  fistulotomy  SURGEON:  Surgeon(s): Leighton Ruff, MD  ASSISTANT: none   ANESTHESIA:   local and MAC  EBL: min  Total I/O In: 200 [I.V.:200] Out: -   Delay start of Pharmacological VTE agent (>24hrs) due to surgical blood loss or risk of bleeding:  not applicable  COUNTS:  YES  PLAN OF CARE: Discharge to home after PACU  PATIENT DISPOSITION:  PACU - hemodynamically stable.  INDICATION: 42 y.o. M with perianal fistula after drainage of a perirectal abscess.    OR FINDINGS: trans-sphincteric anorectal fistula  DESCRIPTION: The patient was identified in the preoperative holding area and taken to the OR where they were laid prone on the operating room table. MAC anesthesia was smoothly induced.  The patient was placed in jack knife position with buttocks taped apart. The patient was then prepped and draped in the usual sterile fashion. A surgical timeout was performed indicating the correct patient, procedure, positioning and preoperative antibiotics. SCDs were noted to be in place and functioning prior to the operation.  I performed a perianal block with 0.25% Marcaine with epinephrine.    I began with a digital exam.  No masses were palpated.  I placed a Hill Ferguson anoscope into the anal canal.  There were no gross abnormalities.  An S shaped fistula probe was placed into the external opening of the fistula in the left anterior position.  This connected to an internal opening at the level of the dentate line in a radial fashion.  There was a small amount of external and internal sphincter above this but it was mostly scar.  I decided to perform a fistulotomy.  This was done with Bovie electrocautery.  The edges  were then marsupialized with a running 3-0 chromic suture.  Lidocaine ointment and a sterile dressing were applied.  The patient was awakened from anesthesia and sent to the PACU in stable condition.  All counts were correct per OR staff.

## 2012-06-30 NOTE — H&P (Signed)
Chief Complaint   Patient presents with   .  New Evaluation     Anal fistula   HISTORY: Marris Frontera. Brinkmeyer is a 42 y.o. male who presents to the office with anorectal fistula. He is s/p abscess drainage in June of last year. Since then, he has continued to have external drainage. This is sometimes bloody ans sometimes clear. He denies fevers, chills or pain with defecation.  Past Medical History   Diagnosis  Date   .  Arthritis    .  Monoclonal gammopathy    .  Anal fissure     Past Surgical History   Procedure  Laterality  Date   .  Incision and drainage perirectal abscess   09/16/2011     Procedure: IRRIGATION AND DEBRIDEMENT PERIRECTAL ABSCESS; Surgeon: Madilyn Hook, DO; Location: [facility]; Service: General; Laterality: N/A;    Current Outpatient Prescriptions   Medication  Sig  Dispense  Refill   .  etodolac (LODINE) 400 MG tablet  Take 400 mg by mouth 2 (two) times daily.     Marland Kitchen  DEPO-TESTOSTERONE 100 MG/ML injection  as needed.     .  nortriptyline (PAMELOR) 10 MG capsule  Take 10 mg by mouth at bedtime.      No current facility-administered medications for this visit.   No Known Allergies  Family History   Problem  Relation  Age of Onset   .  Cancer  Father      lung    History    Social History   .  Marital Status:  Married     Spouse Name:  N/A     Number of Children:  N/A   .  Years of Education:  N/A    Social History Main Topics   .  Smoking status:  Former Smoker     Quit date:  07/10/2011   .  Smokeless tobacco:  Never Used   .  Alcohol Use:  No   .  Drug Use:  No   .  Sexually Active:  Not Currently    Other Topics  Concern   .  None    Social History Narrative   .  None   REVIEW OF SYSTEMS - PERTINENT POSITIVES ONLY:  Review of Systems - General ROS: negative for - chills, fever or weight loss  Hematological and Lymphatic ROS: negative for - bleeding problems, blood clots or bruising  Respiratory ROS: no cough, shortness of breath, or wheezing   Cardiovascular ROS: no chest pain or dyspnea on exertion  Gastrointestinal ROS: no abdominal pain, change in bowel habits, or black or bloody stools  Genito-Urinary ROS: no dysuria, trouble voiding, or hematuria  EXAM:  Filed Vitals:   06/30/12 0938  BP: 113/67  Pulse: 78  Temp: 97.3 F (36.3 C)  Resp: 18    General appearance: alert and cooperative  Resp: clear to auscultation bilaterally  Cardio: regular rate and rhythm  GI: soft, non-tender; bowel sounds normal; no masses, no organomegaly  Anal Exam Findings: ext opening in L ant position, no definite internal opening palpated.  ASSESSMENT AND PLAN:  Lamere Lightner. Garabedian is a 42 y.o. M with what appears to be an anorectal fistula. On exam I can visualize an external opening but can't fully appreciate an internal opening. I have recommended that he undergo anal EUA with either fistulotomy or seton placement. We discussed the risks of the surgery. If he were to have a fistulotomy, there would be a  slight risk of incontinence. Other risks are bleeding, pain and recurrence. I believe he understands these risks and has agreed to proceed.  Rosario Adie, MD  Colon and Rectal Surgery / [facility]

## 2012-06-30 NOTE — Anesthesia Preprocedure Evaluation (Addendum)
Anesthesia Evaluation  Patient identified by MRN, date of birth, ID band Patient awake  General Assessment Comment:Npo today  Reviewed: Allergy & Precautions, H&P , NPO status , Patient's Chart, lab work & pertinent test results, reviewed documented beta blocker date and time   Airway Mallampati: II TM Distance: >3 FB Neck ROM: Full    Dental  (+) Teeth Intact and Dental Advisory Given   Pulmonary former smoker,  breath sounds clear to auscultation        Cardiovascular negative cardio ROS  Rhythm:Regular Rate:Normal  Denies cardiac symptoms   Neuro/Psych negative neurological ROS  negative psych ROS   GI/Hepatic Neg liver ROS, Peri-rectal abscess   Endo/Other  Morbid obesity  Renal/GU negative Renal ROS  negative genitourinary   Musculoskeletal negative musculoskeletal ROS (+)   Abdominal   Peds  Hematology negative hematology ROS (+)   Anesthesia Other Findings Patient indicates not allergic to morphine but became very sedated from narcotic following last anesthetic.  Reproductive/Obstetrics                          Anesthesia Physical Anesthesia Plan  ASA: II  Anesthesia Plan: MAC and General   Post-op Pain Management:    Induction: Intravenous  Airway Management Planned: Simple Face Mask  Additional Equipment:   Intra-op Plan:   Post-operative Plan: Extubation in OR  Informed Consent: I have reviewed the patients History and Physical, chart, labs and discussed the procedure including the risks, benefits and alternatives for the proposed anesthesia with the patient or authorized representative who has indicated his/her understanding and acceptance.   Dental advisory given  Plan Discussed with: CRNA  Anesthesia Plan Comments:         Anesthesia Quick Evaluation

## 2012-06-30 NOTE — Anesthesia Postprocedure Evaluation (Signed)
Anesthesia Post Note  Patient: Kerry Harrison  Procedure(s) Performed: Procedure(s) (LRB): EXAM UNDER ANESTHESIA  fistulotomy, possible  (N/A)  Anesthesia type: MAC  Patient location: PACU  Post pain: Pain level controlled  Post assessment: Post-op Vital signs reviewed  Last Vitals:  Filed Vitals:   06/30/12 1045  BP: 120/69  Pulse:   Temp: 37 C  Resp: 10    Post vital signs: Reviewed  Level of consciousness: sedated  Complications: No apparent anesthesia complications

## 2012-06-30 NOTE — Transfer of Care (Signed)
Immediate Anesthesia Transfer of Care Note  Patient: Kerry Harrison  Procedure(s) Performed: Procedure(s): EXAM UNDER ANESTHESIA  fistulotomy, possible  (N/A)  Patient Location: PACU  Anesthesia Type:MAC  Level of Consciousness: awake, alert  and oriented  Airway & Oxygen Therapy: Patient Spontanous Breathing  Post-op Assessment: Report given to PACU RN  Post vital signs: Reviewed and stable  Complications: No apparent anesthesia complications

## 2012-06-30 NOTE — Addendum Note (Signed)
Addendum created 06/30/12 1133 by Bonney Aid, CRNA   Modules edited: Anesthesia Responsible Staff

## 2012-07-01 ENCOUNTER — Encounter (HOSPITAL_BASED_OUTPATIENT_CLINIC_OR_DEPARTMENT_OTHER): Payer: Self-pay | Admitting: General Surgery

## 2012-07-20 ENCOUNTER — Ambulatory Visit (INDEPENDENT_AMBULATORY_CARE_PROVIDER_SITE_OTHER): Payer: BC Managed Care – PPO | Admitting: General Surgery

## 2012-07-20 ENCOUNTER — Encounter (INDEPENDENT_AMBULATORY_CARE_PROVIDER_SITE_OTHER): Payer: Self-pay

## 2012-07-20 ENCOUNTER — Encounter (INDEPENDENT_AMBULATORY_CARE_PROVIDER_SITE_OTHER): Payer: Self-pay | Admitting: General Surgery

## 2012-07-20 VITALS — BP 118/82 | HR 74 | Temp 97.0°F | Resp 16 | Ht 73.5 in | Wt 295.0 lb

## 2012-07-20 DIAGNOSIS — Z9889 Other specified postprocedural states: Secondary | ICD-10-CM

## 2012-07-20 NOTE — Patient Instructions (Signed)
I will see you back in 3-4 weeks.  Continue with the sitz baths for now.

## 2012-07-20 NOTE — Progress Notes (Signed)
Kerry Harrison is a 42 y.o. male who is status post a fistulotomy on 4/3.  He is doing well.  His pain is better.  His is still having some occasional drainage.  Objective: Filed Vitals:   07/20/12 0911  BP: 118/82  Pulse: 74  Temp: 97 F (36.1 C)  Resp: 16    General appearance: alert and cooperative  Incision: healing well   Assessment: s/p  Patient Active Problem List  Diagnosis  . Anorectal fistula    Plan: Doing well post op.  I will see him back in 3-4 weeks.  He will return to work on May 5.  He will continue the sitz baths.    Rosario Adie, [facility] [place]   07/20/2012 9:29 AM

## 2012-08-10 ENCOUNTER — Encounter (INDEPENDENT_AMBULATORY_CARE_PROVIDER_SITE_OTHER): Payer: BC Managed Care – PPO | Admitting: General Surgery

## 2012-08-25 ENCOUNTER — Encounter (INDEPENDENT_AMBULATORY_CARE_PROVIDER_SITE_OTHER): Payer: Self-pay | Admitting: General Surgery

## 2013-02-09 ENCOUNTER — Telehealth: Payer: Self-pay | Admitting: Hematology and Oncology

## 2013-02-09 NOTE — Telephone Encounter (Signed)
CALLED PT TO SCHEDULE NP APPT NOT ABLE TO LEAVE MESSAGE.

## 2013-04-19 ENCOUNTER — Telehealth: Payer: Self-pay | Admitting: Hematology and Oncology

## 2013-04-19 NOTE — Telephone Encounter (Signed)
lvom for pt to return call in re to referral

## 2013-04-19 NOTE — Telephone Encounter (Signed)
S/w pt and gve np appt 01/29 @ 11 w/Dr. Alvy Bimler Referring Dr. Rayburn Felt Protein found in lab, IFE increased/IGG Welcome packet mailed.

## 2013-04-19 NOTE — Telephone Encounter (Signed)
lvom for pt to return call in re to referral.

## 2013-04-19 NOTE — Telephone Encounter (Signed)
C/D 04/19/13 for appt. 04/27/13

## 2013-04-24 ENCOUNTER — Emergency Department (HOSPITAL_BASED_OUTPATIENT_CLINIC_OR_DEPARTMENT_OTHER)
Admission: EM | Admit: 2013-04-24 | Discharge: 2013-04-24 | Disposition: A | Discharge status: Home / Self Care | Payer: BC Managed Care – PPO | Attending: Emergency Medicine | Admitting: Emergency Medicine

## 2013-04-24 ENCOUNTER — Emergency Department (HOSPITAL_BASED_OUTPATIENT_CLINIC_OR_DEPARTMENT_OTHER): Payer: BC Managed Care – PPO

## 2013-04-24 ENCOUNTER — Encounter (HOSPITAL_BASED_OUTPATIENT_CLINIC_OR_DEPARTMENT_OTHER): Payer: Self-pay | Admitting: Emergency Medicine

## 2013-04-24 DIAGNOSIS — Z87891 Personal history of nicotine dependence: Secondary | ICD-10-CM | POA: Insufficient documentation

## 2013-04-24 DIAGNOSIS — M129 Arthropathy, unspecified: Secondary | ICD-10-CM | POA: Insufficient documentation

## 2013-04-24 DIAGNOSIS — R109 Unspecified abdominal pain: Secondary | ICD-10-CM | POA: Insufficient documentation

## 2013-04-24 DIAGNOSIS — R110 Nausea: Secondary | ICD-10-CM | POA: Insufficient documentation

## 2013-04-24 DIAGNOSIS — K6289 Other specified diseases of anus and rectum: Secondary | ICD-10-CM | POA: Insufficient documentation

## 2013-04-24 DIAGNOSIS — R143 Flatulence: Secondary | ICD-10-CM

## 2013-04-24 DIAGNOSIS — R141 Gas pain: Secondary | ICD-10-CM | POA: Insufficient documentation

## 2013-04-24 DIAGNOSIS — Z79899 Other long term (current) drug therapy: Secondary | ICD-10-CM | POA: Insufficient documentation

## 2013-04-24 DIAGNOSIS — R197 Diarrhea, unspecified: Secondary | ICD-10-CM | POA: Insufficient documentation

## 2013-04-24 DIAGNOSIS — R142 Eructation: Secondary | ICD-10-CM | POA: Insufficient documentation

## 2013-04-24 LAB — CBC WITH DIFFERENTIAL/PLATELET
Basophils Absolute: 0 10*3/uL (ref 0.0–0.1)
Basophils Relative: 0 % (ref 0–1)
Eosinophils Absolute: 0.1 10*3/uL (ref 0.0–0.7)
Eosinophils Relative: 0 % (ref 0–5)
HCT: 41.1 % (ref 39.0–52.0)
HEMOGLOBIN: 13.8 g/dL (ref 13.0–17.0)
LYMPHS ABS: 2.2 10*3/uL (ref 0.7–4.0)
Lymphocytes Relative: 16 % (ref 12–46)
MCH: 26.4 pg (ref 26.0–34.0)
MCHC: 33.6 g/dL (ref 30.0–36.0)
MCV: 78.6 fL (ref 78.0–100.0)
MONOS PCT: 9 % (ref 3–12)
Monocytes Absolute: 1.2 10*3/uL — ABNORMAL HIGH (ref 0.1–1.0)
NEUTROS ABS: 10.1 10*3/uL — AB (ref 1.7–7.7)
NEUTROS PCT: 74 % (ref 43–77)
PLATELETS: 460 10*3/uL — AB (ref 150–400)
RBC: 5.23 MIL/uL (ref 4.22–5.81)
RDW: 17.8 % — ABNORMAL HIGH (ref 11.5–15.5)
WBC: 13.6 10*3/uL — AB (ref 4.0–10.5)

## 2013-04-24 LAB — COMPREHENSIVE METABOLIC PANEL
ALBUMIN: 3.1 g/dL — AB (ref 3.5–5.2)
ALK PHOS: 72 U/L (ref 39–117)
ALT: 9 U/L (ref 0–53)
AST: 11 U/L (ref 0–37)
BILIRUBIN TOTAL: 0.4 mg/dL (ref 0.3–1.2)
BUN: 10 mg/dL (ref 6–23)
CHLORIDE: 95 meq/L — AB (ref 96–112)
CO2: 25 mEq/L (ref 19–32)
Calcium: 9.7 mg/dL (ref 8.4–10.5)
Creatinine, Ser: 0.6 mg/dL (ref 0.50–1.35)
GFR calc Af Amer: >90 mL/min (ref >90)
GFR calc non Af Amer: >90 mL/min (ref >90)
GLUCOSE: 90 mg/dL (ref 70–99)
POTASSIUM: 3.8 meq/L (ref 3.7–5.3)
SODIUM: 135 meq/L — AB (ref 137–147)
TOTAL PROTEIN: 9.3 g/dL — AB (ref 6.0–8.3)

## 2013-04-24 LAB — URINE MICROSCOPIC-ADD ON

## 2013-04-24 LAB — URINALYSIS, ROUTINE W REFLEX MICROSCOPIC
BILIRUBIN URINE: NEGATIVE
GLUCOSE, UA: NEGATIVE mg/dL
Ketones, ur: NEGATIVE mg/dL
Leukocytes, UA: NEGATIVE
Nitrite: NEGATIVE
PH: 5.5 (ref 5.0–8.0)
Protein, ur: NEGATIVE mg/dL
SPECIFIC GRAVITY, URINE: 1.023 (ref 1.005–1.030)
Urobilinogen, UA: 0.2 mg/dL (ref 0.0–1.0)

## 2013-04-24 MED ORDER — OXYCODONE-ACETAMINOPHEN 5-325 MG PO TABS: 2.0000 | ORAL_TABLET | ORAL | Status: DC | PRN

## 2013-04-24 MED ORDER — METRONIDAZOLE 500 MG PO TABS: 500.0000 mg | ORAL_TABLET | Freq: Two times a day (BID) | ORAL | Status: DC

## 2013-04-24 MED ORDER — IOHEXOL 300 MG/ML  SOLN
50.0000 mL | Freq: Once | INTRAMUSCULAR | Status: AC | PRN
  Administered 2013-04-24: 50 mL via ORAL

## 2013-04-24 MED ORDER — IOHEXOL 300 MG/ML  SOLN
100.0000 mL | Freq: Once | INTRAMUSCULAR | Status: AC | PRN
  Administered 2013-04-24: 100 mL via INTRAVENOUS

## 2013-04-24 MED ORDER — CIPROFLOXACIN HCL 500 MG PO TABS: 500.0000 mg | ORAL_TABLET | Freq: Two times a day (BID) | ORAL | Status: DC

## 2013-04-24 NOTE — Discharge Instructions (Signed)
Abdominal Pain, Adult °Many things can cause abdominal pain. Usually, abdominal pain is not caused by a disease and will improve without treatment. It can often be observed and treated at home. Your health care provider will do a physical exam and possibly order blood tests and X-rays to help determine the seriousness of your pain. However, in many cases, more time must pass before a clear cause of the pain can be found. Before that point, your health care provider may not know if you need more testing or further treatment. °HOME CARE INSTRUCTIONS  °Monitor your abdominal pain for any changes. The following actions may help to alleviate any discomfort you are experiencing: °· Only take over-the-counter or prescription medicines as directed by your health care provider. °· Do not take laxatives unless directed to do so by your health care provider. °· Try a clear liquid diet (broth, tea, or water) as directed by your health care provider. Slowly move to a bland diet as tolerated. °SEEK MEDICAL CARE IF: °· You have unexplained abdominal pain. °· You have abdominal pain associated with nausea or diarrhea. °· You have pain when you urinate or have a bowel movement. °· You experience abdominal pain that wakes you in the night. °· You have abdominal pain that is worsened or improved by eating food. °· You have abdominal pain that is worsened with eating fatty foods. °SEEK IMMEDIATE MEDICAL CARE IF:  °· Your pain does not go away within 2 hours. °· You have a fever. °· You keep throwing up (vomiting). °· Your pain is felt only in portions of the abdomen, such as the right side or the left lower portion of the abdomen. °· You pass bloody or black tarry stools. °MAKE SURE YOU: °· Understand these instructions.   °· Will watch your condition.   °· Will get help right away if you are not doing well or get worse.   °Document Released: 12/24/2004 Document Revised: 01/04/2013 Document Reviewed: 11/23/2012 °[employer]® Patient  Information ©2014 [employer]. ° °

## 2013-04-24 NOTE — ED Provider Notes (Signed)
Medical screening examination/treatment/procedure(s) were performed by non-physician practitioner and as supervising physician I was immediately available for consultation/collaboration.  EKG Interpretation   None         Ezequiel Essex, MD 04/24/13 2322

## 2013-04-24 NOTE — ED Notes (Signed)
Pt c/o lower abd pain  With " gas pain " also c/o " pudding like stool"  X 3 days

## 2013-04-24 NOTE — ED Notes (Signed)
Patient transported to CT 

## 2013-04-24 NOTE — ED Provider Notes (Signed)
CSN: [id number]     Arrival date & time 04/24/13  1456 History   First MD Initiated Contact with Patient 04/24/13 1727     Chief Complaint  Patient presents with  . Abdominal Pain   (Consider location/radiation/quality/duration/timing/severity/associated sxs/prior Treatment) Patient is a 43 y.o. male presenting with abdominal pain. The history is provided by the patient. No language interpreter was used.  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   Pt complains of lower abdominal pain and rectal pain.   Pt reports stool is like pudding.   Pt complains of multiple BM's a day  Past Medical History  Diagnosis Date  . Arthritis   . Anorectal fistula    Past Surgical History  Procedure Laterality Date  . Incision and drainage perirectal abscess  09/16/2011    Procedure: IRRIGATION AND DEBRIDEMENT PERIRECTAL ABSCESS;  Surgeon: Madilyn Hook, DO;  Location: [facility];  Service: General;  Laterality: N/A;  . Evaluation under anesthesia with anal fistulectomy N/A 06/30/2012    Procedure: EXAM UNDER ANESTHESIA  fistulotomy, possible ;  Surgeon: Leighton Ruff, MD;  Location: [place] [facility];  Service: General;  Laterality: N/A;   Family History  Problem Relation Age of Onset  . Cancer Father     lung   History  Substance Use Topics  . Smoking status: Former Smoker -- 6 years    Types: Cigarettes    Quit date: 07/10/2011  . Smokeless tobacco: Never Used  . Alcohol Use: 1.2 oz/week    2 Cans of beer per week    Review of Systems  Gastrointestinal: Positive for nausea, abdominal pain, diarrhea and rectal pain.   All other systems reviewed and are negative.    Allergies  Morphine and related  Home Medications   Current Outpatient Rx  Name  Route  Sig  Dispense  Refill  . DEPO-TESTOSTERONE 100 MG/ML injection               . etodolac (LODINE) 400 MG tablet   Oral   Take 400 mg by mouth 2 (two) times daily.         Marland Kitchen HYDROcodone-acetaminophen (NORCO/VICODIN) 5-325 MG per tablet   Oral   Take 1 tablet by mouth every 6 (six) hours as needed for pain.   30 tablet   0   . nortriptyline (PAMELOR) 10 MG capsule   Oral   Take 10 mg by mouth at bedtime.          BP 134/78  Pulse 76  Temp(Src) 98.2 F (36.8 C)  Resp 16  Ht 6' 1"  (1.854 m)  Wt 280 lb (127.007 kg)  BMI 36.95 kg/m2  SpO2 100% Physical Exam  Nursing note and vitals reviewed. Constitutional: He appears well-developed and well-nourished.  HENT:  Head: Normocephalic.  Right Ear: External ear normal.  Left Ear: External ear normal.  Nose: Nose normal.  Mouth/Throat: Oropharynx is clear and moist.  Eyes: Conjunctivae and EOM are normal. Pupils are equal, round, and reactive to light.  Neck: Normal range of motion. Neck supple.  Cardiovascular: Normal rate and normal heart sounds.   Pulmonary/Chest:  Effort normal.  Abdominal: Soft. There is tenderness.  Genitourinary: Rectum normal.  Musculoskeletal: Normal range of motion.  Neurological: He is alert.  Skin: Skin is warm.  Psychiatric: He has a normal mood and affect.    ED Course  Procedures (including critical care time) Labs Review Labs Reviewed  URINALYSIS, ROUTINE W REFLEX MICROSCOPIC - Abnormal; Notable for the following:    Hgb urine dipstick MODERATE (*)    All other components within normal limits  CBC WITH DIFFERENTIAL - Abnormal; Notable for the following:    WBC 13.6 (*)    RDW 17.8 (*)    Platelets 460 (*)    Neutro Abs 10.1 (*)    Monocytes Absolute 1.2 (*)    All other components within normal limits  COMPREHENSIVE METABOLIC PANEL -  Abnormal; Notable for the following:    Sodium 135 (*)    Chloride 95 (*)    Total Protein 9.3 (*)    Albumin 3.1 (*)    All other components within normal limits  URINE MICROSCOPIC-ADD ON   Imaging Review Ct Abdomen Pelvis W Contrast  04/24/2013   CLINICAL DATA:  Lower abdominal pain. History of perirectal abscess.  EXAM: CT ABDOMEN AND PELVIS WITH CONTRAST  TECHNIQUE: Multidetector CT imaging of the abdomen and pelvis was performed using the standard protocol following bolus administration of intravenous contrast.  CONTRAST:  44m OMNIPAQUE IOHEXOL 300 MG/ML SOLN, 101mOMNIPAQUE IOHEXOL 300 MG/ML SOLN  COMPARISON:  09/16/2011  FINDINGS: Lung bases show mild linear scarring or atelectasis. No pleural or pericardial fluid.  The liver has a normal appearance without focal lesions or biliary ductal dilatation. No calcified gallstones. The spleen is normal. The pancreas is normal. The adrenal glands are normal. The kidneys are normal. No cyst, mass, stone or hydronephrosis. The aorta and IVC are normal. No retroperitoneal mass. There are a few small retroperitoneal lymph nodes unchanged from the previous examination and not likely significant. There is no free intraperitoneal fluid or air. No bowel pathology evident in the abdominal portion of the scan. The appendix is normal.  In the pelvis, the bladder, prostate gland and seminal vesicles are normal. I do not see any definite rectal or perirectal pathology. There is a 2.2 x 2.5 cm entity within the inguinal canal on the left. This could be a lymph node or possibly an atrophic test is. The left testicle. Normally positioned and normal-sized on the previous study however.  IMPRESSION: No significant finding on today's examination. No sign of active bowel pathology or inflammation.  2.2 x 2.5 cm entity within the inguinal canal on the left of uncertain nature. This could be a lymph node or an atrophic testicle. See above discussion.   Electronically Signed    By: MaNelson Chimes.D.   On: 04/24/2013 19:25    EKG Interpretation   None       MDM ct normal.  No wbc elevation.    I am going to treat with cipro and flagyl.   Pt given percocet for pain.   Pt advised to follow up with Gi and his surgeon for evaluation   1. Abdominal pain       LeFransico MeadowPAVermont1/26/15 2043

## 2013-04-27 ENCOUNTER — Ambulatory Visit: Payer: BC Managed Care – PPO

## 2013-04-27 ENCOUNTER — Ambulatory Visit: Payer: BC Managed Care – PPO | Admitting: Hematology and Oncology

## 2013-05-05 ENCOUNTER — Emergency Department (HOSPITAL_BASED_OUTPATIENT_CLINIC_OR_DEPARTMENT_OTHER): Payer: BC Managed Care – PPO

## 2013-05-05 ENCOUNTER — Encounter (HOSPITAL_BASED_OUTPATIENT_CLINIC_OR_DEPARTMENT_OTHER): Payer: Self-pay | Admitting: Emergency Medicine

## 2013-05-05 ENCOUNTER — Emergency Department (HOSPITAL_BASED_OUTPATIENT_CLINIC_OR_DEPARTMENT_OTHER)
Admission: EM | Admit: 2013-05-05 | Discharge: 2013-05-05 | Disposition: A | Discharge status: Home / Self Care | Payer: BC Managed Care – PPO | Attending: Emergency Medicine | Admitting: Emergency Medicine

## 2013-05-05 DIAGNOSIS — K603 Anal fistula, unspecified: Secondary | ICD-10-CM | POA: Insufficient documentation

## 2013-05-05 DIAGNOSIS — K921 Melena: Secondary | ICD-10-CM | POA: Insufficient documentation

## 2013-05-05 DIAGNOSIS — R1084 Generalized abdominal pain: Secondary | ICD-10-CM | POA: Insufficient documentation

## 2013-05-05 DIAGNOSIS — Z87891 Personal history of nicotine dependence: Secondary | ICD-10-CM | POA: Insufficient documentation

## 2013-05-05 DIAGNOSIS — M129 Arthropathy, unspecified: Secondary | ICD-10-CM | POA: Insufficient documentation

## 2013-05-05 DIAGNOSIS — Z792 Long term (current) use of antibiotics: Secondary | ICD-10-CM | POA: Insufficient documentation

## 2013-05-05 DIAGNOSIS — Z79899 Other long term (current) drug therapy: Secondary | ICD-10-CM | POA: Insufficient documentation

## 2013-05-05 DIAGNOSIS — K6289 Other specified diseases of anus and rectum: Secondary | ICD-10-CM | POA: Insufficient documentation

## 2013-05-05 LAB — BASIC METABOLIC PANEL
BUN: 8 mg/dL (ref 6–23)
CALCIUM: 9.3 mg/dL (ref 8.4–10.5)
CO2: 25 meq/L (ref 19–32)
CREATININE: 0.8 mg/dL (ref 0.50–1.35)
Chloride: 101 mEq/L (ref 96–112)
Glucose, Bld: 94 mg/dL (ref 70–99)
Potassium: 3.7 mEq/L (ref 3.7–5.3)
SODIUM: 139 meq/L (ref 137–147)

## 2013-05-05 LAB — CBC WITH DIFFERENTIAL/PLATELET
Basophils Absolute: 0 K/uL (ref 0.0–0.1)
Basophils Relative: 0 % (ref 0–1)
Eosinophils Absolute: 0.2 K/uL (ref 0.0–0.7)
Eosinophils Relative: 3 % (ref 0–5)
HCT: 38.8 % — ABNORMAL LOW (ref 39.0–52.0)
Hemoglobin: 12.9 g/dL — ABNORMAL LOW (ref 13.0–17.0)
Lymphocytes Relative: 21 % (ref 12–46)
Lymphs Abs: 1.6 K/uL (ref 0.7–4.0)
MCH: 27 pg (ref 26.0–34.0)
MCHC: 33.2 g/dL (ref 30.0–36.0)
MCV: 81.2 fL (ref 78.0–100.0)
Monocytes Absolute: 0.5 K/uL (ref 0.1–1.0)
Monocytes Relative: 7 % (ref 3–12)
Neutro Abs: 5.2 K/uL (ref 1.7–7.7)
Neutrophils Relative %: 69 % (ref 43–77)
Platelets: 408 K/uL — ABNORMAL HIGH (ref 150–400)
RBC: 4.78 MIL/uL (ref 4.22–5.81)
RDW: 17 % — ABNORMAL HIGH (ref 11.5–15.5)
WBC: 7.6 K/uL (ref 4.0–10.5)

## 2013-05-05 MED ORDER — OXYCODONE-ACETAMINOPHEN 5-325 MG PO TABS: 1.0000 | ORAL_TABLET | Freq: Four times a day (QID) | ORAL | Status: DC | PRN

## 2013-05-05 MED ORDER — IOHEXOL 300 MG/ML  SOLN
100.0000 mL | Freq: Once | INTRAMUSCULAR | Status: AC | PRN
  Administered 2013-05-05: 100 mL via INTRAVENOUS

## 2013-05-05 MED ORDER — LIDOCAINE HCL 2 % EX GEL
Freq: Once | CUTANEOUS | Status: AC
  Administered 2013-05-05: 11:00:00 via TOPICAL
  Filled 2013-05-05: qty 20

## 2013-05-05 MED ORDER — LIDOCAINE HCL 2 % EX GEL: 1.0000 "application " | CUTANEOUS | Status: DC | PRN

## 2013-05-05 NOTE — ED Provider Notes (Addendum)
CSN: [id number]     Arrival date & time 05/05/13  2778 History   First MD Initiated Contact with Patient 05/05/13 1002     Chief Complaint  Patient presents with  . Rectal Pain   (Consider location/radiation/quality/duration/timing/severity/associated sxs/prior Treatment) Patient is a 43 y.o. male presenting with hematochezia. The history is provided by the patient.  Rectal Bleeding Quality:  Bright red Amount:  Scant Duration:  2 days Timing:  Intermittent Progression:  Waxing and waning Chronicity:  Recurrent Context: defecation and rectal pain   Context: not constipation and not rectal injury   Pain details:    Quality:  Burning and aching   Severity:  Moderate   Duration:  4 days   Timing:  Constant   Progression:  Worsening Similar prior episodes: yes   Relieved by:  Nothing Worsened by:  Defecation Ineffective treatments:  Sitz baths and time Associated symptoms: abdominal pain   Associated symptoms: no fever, no hematemesis, no light-headedness and no vomiting   Associated symptoms comment:  Soreness generalized in the abd.  Loose stool but no definite diarrhea Risk factors comment:  Prior history of perirectal abscess and fistula x2   Past Medical History  Diagnosis Date  . Arthritis   . Anorectal fistula    Past Surgical History  Procedure Laterality Date  . Incision and drainage perirectal abscess  09/16/2011    Procedure: IRRIGATION AND DEBRIDEMENT PERIRECTAL ABSCESS;  Surgeon: Madilyn Hook, DO;  Location: [facility];  Service: General;  Laterality: N/A;  . Evaluation under anesthesia with anal fistulectomy N/A 06/30/2012    Procedure: EXAM UNDER ANESTHESIA  fistulotomy, possible ;  Surgeon: Leighton Ruff, MD;  Location: [place];  Service: General;  Laterality: N/A;   Family History  Problem Relation Age of Onset  . Cancer Father     lung   History  Substance Use Topics  . Smoking status: Former Smoker -- 6 years    Types: Cigarettes   Quit date: 07/10/2011  . Smokeless tobacco: Never Used  . Alcohol Use: 1.2 oz/week    2 Cans of beer per week    Review of Systems  Constitutional: Negative for fever.  Gastrointestinal: Positive for abdominal pain and hematochezia. Negative for vomiting and hematemesis.  Neurological: Negative for light-headedness.  All other systems reviewed and are negative.    Allergies  Morphine and related  Home Medications   Current Outpatient Rx  Name  Route  Sig  Dispense  Refill  . ciprofloxacin (CIPRO) 500 MG tablet   Oral   Take 1 tablet (500 mg total) by mouth 2 (two) times daily.   20 tablet   0   . DEPO-TESTOSTERONE 100 MG/ML injection               . etodolac (LODINE) 400 MG tablet   Oral   Take 400 mg by mouth 2 (two) times daily.         Marland Kitchen HYDROcodone-acetaminophen (NORCO/VICODIN) 5-325 MG per tablet   Oral   Take 1 tablet by mouth every 6 (six) hours as needed for pain.   30 tablet   0   . metroNIDAZOLE (FLAGYL) 500 MG tablet   Oral   Take 1 tablet (500 mg total) by mouth 2 (two) times daily.   14 tablet   0   . nortriptyline (PAMELOR) 10 MG capsule   Oral   Take 10 mg by mouth at bedtime.         Marland Kitchen oxyCODONE-acetaminophen (  PERCOCET/ROXICET) 5-325 MG per tablet   Oral   Take 2 tablets by mouth every 4 (four) hours as needed for severe pain.   20 tablet   0    BP 122/76  Pulse 92  Temp(Src) 97.6 F (36.4 C) (Oral)  Resp 20  Ht 6' 1.5" (1.867 m)  Wt 270 lb (122.471 kg)  BMI 35.14 kg/m2  SpO2 98% Physical Exam  Nursing note and vitals reviewed. Constitutional: He is oriented to person, place, and time. He appears well-developed and well-nourished. No distress.  HENT:  Head: Normocephalic and atraumatic.  Mouth/Throat: Oropharynx is clear and moist.  Eyes: Conjunctivae and EOM are normal. Pupils are equal, round, and reactive to light.  Neck: Normal range of motion. Neck supple.  Cardiovascular: Normal rate, regular rhythm and intact  distal pulses.   No murmur heard. Pulmonary/Chest: Effort normal and breath sounds normal. No respiratory distress. He has no wheezes. He has no rales.  Abdominal: Soft. He exhibits no distension. There is generalized tenderness. There is no rebound and no guarding.  Mild diffuse tenderness without any rebound or guarding  Genitourinary: Rectal exam shows tenderness. Rectal exam shows no external hemorrhoid and no fissure.  Tenderness with palpation around the entire circumference of the rectum. Mild firmness and worsening pain about 6:00 internally.  No rectal bleeding present currently  Musculoskeletal: Normal range of motion. He exhibits no edema and no tenderness.  Neurological: He is alert and oriented to person, place, and time.  Skin: Skin is warm and dry. No rash noted. No erythema.  Psychiatric: He has a normal mood and affect. His behavior is normal.    ED Course  Procedures (including critical care time) Labs Review Labs Reviewed  CBC WITH DIFFERENTIAL - Abnormal; Notable for the following:    Hemoglobin 12.9 (*)    HCT 38.8 (*)    RDW 17.0 (*)    Platelets 408 (*)    All other components within normal limits  BASIC METABOLIC PANEL   Imaging Review Ct Pelvis W Contrast  05/05/2013   CLINICAL DATA:  Lower abdominal pain.  Perirectal abscess.  EXAM: CT PELVIS WITH CONTRAST  TECHNIQUE: Multidetector CT imaging of the pelvis was performed using the standard protocol following the bolus administration of intravenous contrast.  CONTRAST:  188m OMNIPAQUE IOHEXOL 300 MG/ML  SOLN  COMPARISON:  CT ABD/PELVIS W CM dated 04/24/2013; CT PELVIS W/CM dated 09/14/2011  FINDINGS: In the interval since the prior exam, there is no significant interval change. No perirectal abscess is identified. By CT, the rectum and perineum appear within normal limits. There are no inflammatory changes in the ischiorectal fossa. Unchanged soft tissue density in the left inguinal canal, which may represent a small  lymph node. Urinary bladder, prostate gland rectum and sigmoid colon appear within normal limits. Pelvic small bowel appears normal. Bones are normal.  Notably, the left inguinal canal nodule has been stable dating back to 2013 consistent with a benign etiology. Potentially this relates to prior instrumentation/vasectomy.  IMPRESSION: Negative for perirectal abscess. No rectal or sigmoid inflammatory changes small nodule in the left inguinal canal is unchanged. Left testicle partially visualized.   Electronically Signed   By: GDereck LigasM.D.   On: 05/05/2013 12:08    EKG Interpretation   None       MDM   1. Rectal pain     Patient with a history of perirectal abscess and fistula who's had to have surgery x2 presents after being seen on  04/24/2013 with worsening rectal pain. Patient states he's had more swelling and rectal pain for the last 3 days.  He's been doing sitz baths and taking pain medicine when necessary without improvement. Intermittent rectal bleeding.  He denies fever but has had some lower abdominal tenderness and loose stool.  Patient upon being seen here 2 weeks ago had a CT scan that was negative for acute process but at that time he denied having rectal pain. He has no prostate tenderness and has no urinary complaints. On exam he has tenderness in his rectum with firmness palpated at 6:00. No external signs of swelling. Will ensure that patient has not had recurrence of the perirectal abscess today. He has made an appointment with his surgeon Dr. Marcello Moores but is unable to see her until March. When he called the office today the number point to try to fit him in next week.  CBC with an improved white count today BMP within normal limits. CT scan pending.  12:23 PM Labs wnl.  CT without signs of abscess.  1:06 PM CT neg for abscess.  Spoke with Dr. Marcello Moores and she recommended sitz baths and lidocaine jelly.  Pt will wall office on Monday for earlier appt.  Blanchie Dessert, MD 05/05/13 1306  Blanchie Dessert, MD 05/05/13 1308

## 2013-05-05 NOTE — Discharge Instructions (Signed)
Anal Fistula An anal fistula is an abnormal tunnel that develops between the bowel and skin near the outside of the anus, where feces comes out. The anus has a number of tiny glands that make lubricating fluid. Sometimes these glands can become plugged and infected. This may lead to the development of a fluid-filled pocket (abscess). An anal fistula often develops after this infection or abscess. It is nearly always caused by a past or current anal abscess.  CAUSES  Though an anal fistula is almost always caused by a past or current anal abscess, other causes can include:  A complication of surgery.  Trauma to the rectal area.  Radiation to the area.  Other medical conditions or diseases, such as:   Chronic inflammatory bowel disease, such as Crohn disease or ulcerative colitis.   Colon or rectal cancer.   Diverticular disease, such as diverticulitis.   A sexually transmitted disease, such as gonorrhea, chlamydia, or syphilis.  An HIV infection or AIDS.  SYMPTOMS   Throbbing or constant pain that may be worse when sitting.   Swelling or irritation around the anus.   Drainage of pus or blood from an opening near the anus.   Pain with bowel movements.  Fever or chills. DIAGNOSIS  Your caregiver will examine the area to find the openings of the anal fistula and the fistula tract. The external opening of the anal fistula may be seen during a physical examination. Other examinations that may be performed include:   Examination of the rectal area with a gloved hand (digital rectal exam).   Examination with a probe or scope to help locate the internal opening of the fistula.   Injection of a dye into the fistula opening. X-rays can be taken to find the exact location and path of the fistula.   An MRI or ultrasound of the anal area.  Other tests may be performed to find the cause of the anal fistula.   TREATMENT  The most common treatment for an anal fistula is  surgery. There are different surgery options depending on where your fistula is located and how complex the fistula is. Surgical options include:  A fistulotomy. This surgery involves opening up the whole fistula and draining the contents inside to promote healing.  Seton placement. A silk string (seton) is placed into the fistula during a fistulotomy to drain any infection to promote healing.  Advancement flap procedure. Tissue is removed from your rectum or the skin around the anus and is attached to the opening of the fistula.  Bioprosthetic plug. A cone-shaped plug is made from your tissue and is used to block the opening of the fistula. Some anal fistulas do not require surgery. A fibrin glue is a non-surgical option that involves injecting the glue to seal the fistula. You also may be prescribed an antibiotic medicine to treat an infection.  HOME CARE INSTRUCTIONS   Take your antibiotics as directed. Finish them even if you start to feel better.  Only take over-the-counter or prescription medicines as directed by your caregiver.Use a stool softener or laxative, if recommended.   Eat a high-fiber diet to help avoid constipation or as directed by your caregiver.  Drink enough water to keep your urine clear or pale yellow.   A warm sitz bath may be soothing and help with healing. You may take warm sitz baths for 15 20 minutes, 3 4 times a day to ease pain and discomfort.   Follow excellent hygiene to keep the  anal area as clean and dry as possible. Use wet toilet paper or moist towelettes after each bowel movement.  SEEK MEDICAL CARE IF: You have increased pain not controlled with medicines.  SEEK IMMEDIATE MEDICAL CARE IF:  You have severe, intolerable pain.  You have new swelling, redness, or discharge around the anal area.  You have tenderness or warmth around the anal area.  You have chills or diarrhea.  You have severe problems urinating or having a bowel movement.    You have a fever or persistent symptoms for more than 2 3 days.   You have a fever and your symptoms suddenly get worse.  MAKE SURE YOU:   Understand these instructions.  Will watch your condition.  Will get help right away if you are not doing well or get worse. Document Released: 02/27/2008 Document Revised: 03/02/2012 Document Reviewed: 01/19/2011 [facility] Patient Information 2014 [employer].

## 2013-05-05 NOTE — ED Notes (Signed)
Nurse paged out call to Dr. Marcello Moores

## 2013-05-05 NOTE — ED Notes (Signed)
Pt amb to room 9 with quick steady gait in nad. Pt reports ongoing rectal pain x 1 month. Seen here and rx atbx and pain meds, told to f/u with his pcp, pt states they cannot see him until march, and his pain has worsened.

## 2013-05-09 ENCOUNTER — Encounter (INDEPENDENT_AMBULATORY_CARE_PROVIDER_SITE_OTHER): Payer: Self-pay | Admitting: General Surgery

## 2013-05-09 ENCOUNTER — Ambulatory Visit (INDEPENDENT_AMBULATORY_CARE_PROVIDER_SITE_OTHER): Payer: BC Managed Care – PPO | Admitting: General Surgery

## 2013-05-09 VITALS — BP 116/78 | HR 88 | Temp 98.1°F | Resp 14 | Ht 73.5 in | Wt 278.6 lb

## 2013-05-09 DIAGNOSIS — R197 Diarrhea, unspecified: Secondary | ICD-10-CM

## 2013-05-09 NOTE — Patient Instructions (Signed)
GETTING TO GOOD BOWEL HEALTH. Irregular bowel habits such as diarrhea can lead to many problems over time.  Having one soft, formed bowel movement a day is the most important way to prevent further problems.  The anorectal canal is designed to handle stretching and feces to safely manage our ability to get rid of solid waste (feces, poop, stool) out of our body.  BUT, diarrhea can be a burning fire to this very sensitive area of our body, causing inflamed hemorrhoids, anal fissures, increasing risk is perirectal abscesses, abdominal pain and bloating.     The goal: ONE SOFT BOWEL MOVEMENT A DAY!  To have soft, regular bowel movements:    Drink at least 8 tall glasses of water a day.     Take plenty of fiber.  Fiber is the undigested part of plant food that passes into the colon, acting s "natures broom" to encourage bowel motility and movement.  Fiber can absorb and hold large amounts of water. This results in a larger, bulkier stool, which is soft and easier to pass. Work gradually over several weeks up to 6 servings a day of fiber (25g a day even more if needed) in the form of: o Whole grain breads, pasta, etc (whole wheat)   o Bran cereals    Bulking Agents -- This type of water-retaining fiber generally is easily obtained each day by one of the following:  o Methylcellulose -- This is another fiber derived from wood which also retains water. It is available as Citrucel and Benfiber and a pill called Fibercon.   Controlling diarrhea o Switch to liquids and simpler foods for a few days to avoid stressing your intestines further. o Avoid dairy products (especially milk & ice cream) for a short time.  The intestines often can lose the ability to digest lactose when stressed. o Avoid foods that cause gassiness or bloating.  Typical foods include beans and other legumes, cabbage, broccoli, and dairy foods.  Every person has some sensitivity to other foods, so listen to our body and avoid those foods that  trigger problems for you. o Adding fiber gradually can help thicken stools by absorbing excess fluid and retrain the intestines to act more normally.  Slowly increase the dose over a few weeks.  Too much fiber too soon can backfire and cause cramping & bloating. o Probiotics (such as active yogurt, Align, etc) may help repopulate the intestines and colon with normal bacteria and calm down a sensitive digestive tract.  Most studies show it to be of mild help, though, and such products can be costly. o Medicines:   Bismuth subsalicylate (ex. Kayopectate, Pepto Bismol) every 30 minutes for up to 6 doses can help control diarrhea.  Avoid if pregnant.   Loperamide (Immodium) can slow down diarrhea.  Start with two tablets (52m total) first and then try one tablet every 6 hours.  Avoid if you are having fevers or severe pain.  If you are not better or start feeling worse, stop all medicines and call your doctor for advice o Call your doctor if you are getting worse or not better.  Sometimes further testing (cultures, endoscopy, X-ray studies, bloodwork, etc) may be needed to help diagnose and treat the cause of the diarrhea. o

## 2013-05-09 NOTE — Progress Notes (Signed)
Kerry Harrison is a 43 y.o. male who is here for a follow up visit regarding anal pain.  He states that he had an episode of acute diarrhea for several days last week.  He then developed severe pain after multiple BM's.  Described as a burning sensation.  Denies any drainage.  Denies fevers, blood in stool or abd pain.  He had this once before he developed the fistula.  His BM's are currently soft.  His pain is better.    Objective: Filed Vitals:   05/09/13 1113  BP: 116/78  Pulse: 88  Temp: 98.1 F (36.7 C)  Resp: 14    General appearance: alert and cooperative GI: normal findings: soft, non-tender Anal: Fistula repair healed.  No signs of recurrence.  No signs of incontinence. skin excoriation posteriorly.    Assessment and Plan: Kerry Harrison appears to have anal pain related to his diarrhea.  I do not see any signs of recurrent fistula.  I have given him a handout on how to control acute episodes of diarrhea.  If he continues to have these problems chronically, he will need a GI workup and possibly a colonoscopy.      Rosario Adie, MD [facility], [place]

## 2013-05-15 ENCOUNTER — Encounter (INDEPENDENT_AMBULATORY_CARE_PROVIDER_SITE_OTHER): Payer: BC Managed Care – PPO | Admitting: General Surgery

## 2013-05-18 ENCOUNTER — Encounter (INDEPENDENT_AMBULATORY_CARE_PROVIDER_SITE_OTHER): Payer: BC Managed Care – PPO | Admitting: General Surgery

## 2013-05-19 ENCOUNTER — Telehealth: Payer: Self-pay | Admitting: *Deleted

## 2013-05-19 NOTE — Telephone Encounter (Signed)
Left VM for pt to return nurse call to get his missed New Pt appt rescheduled.  Want to inform pt when he calls back that Dr. Alvy Bimler feels is it important he makes another appt to see her.

## 2013-05-22 ENCOUNTER — Telehealth: Payer: Self-pay | Admitting: Hematology and Oncology

## 2013-05-22 NOTE — Telephone Encounter (Signed)
PATIENT CALLED TO R/S NEW PATIENT APPT TO 02/26 @ 10:45 W/DR. [place].

## 2013-05-25 ENCOUNTER — Ambulatory Visit: Payer: BC Managed Care – PPO

## 2013-05-25 ENCOUNTER — Ambulatory Visit: Payer: BC Managed Care – PPO | Admitting: Hematology and Oncology

## 2013-05-26 ENCOUNTER — Telehealth: Payer: Self-pay | Admitting: Hematology and Oncology

## 2013-05-26 NOTE — Telephone Encounter (Signed)
LEFT MESSAGE FOR PATIENT TO RETURN CALL TO R/S NEW PATIENT APPT.

## 2014-03-31 IMAGING — US US EXTREM LOW VENOUS*R*
1 series · 14 of 24 positions shown · non-contrast
Comparison: None

CLINICAL DATA: leg swelling and leg pain;;

RIGHT LOWER EXTREMITY VENOUS DUPLEX ULTRASOUND
TECHNIQUE: Gray-scale sonography with compression, as well as color
and duplex ultrasound, were performed to evaluate the deep venous
system from the level of the common femoral vein through the
popliteal and proximal calf veins.

[Series 1: us extrem low venous*right* · 14 of 36 slices shown]
[im 1/36]
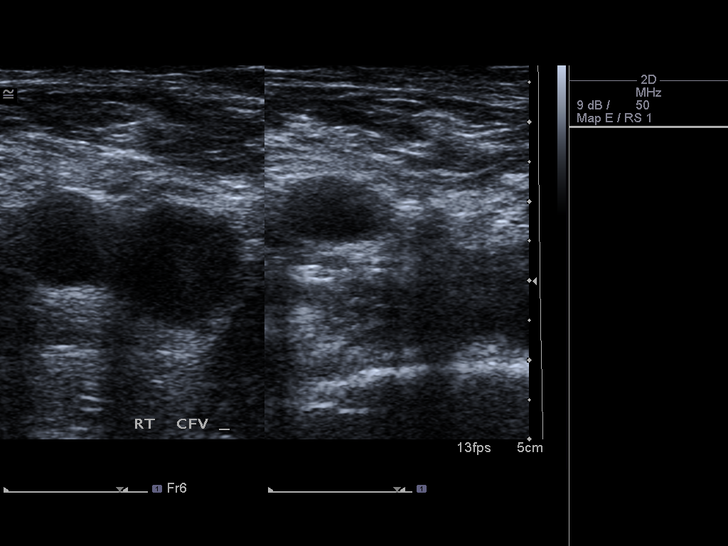
[im 4/36]
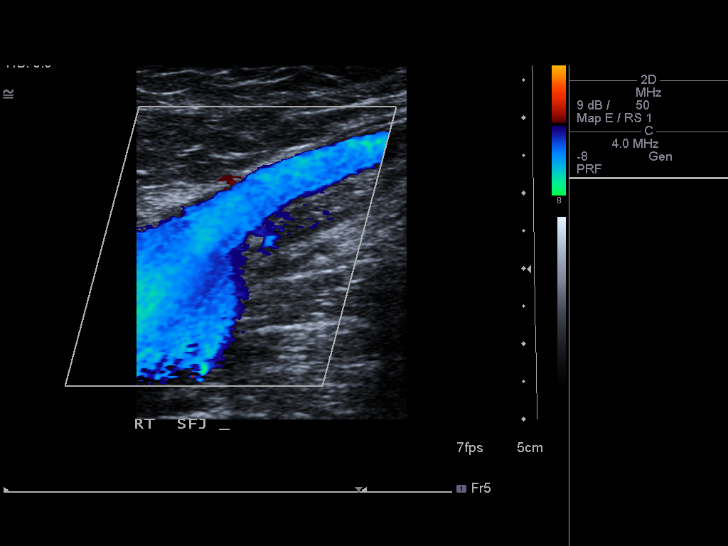
[im 7/36]
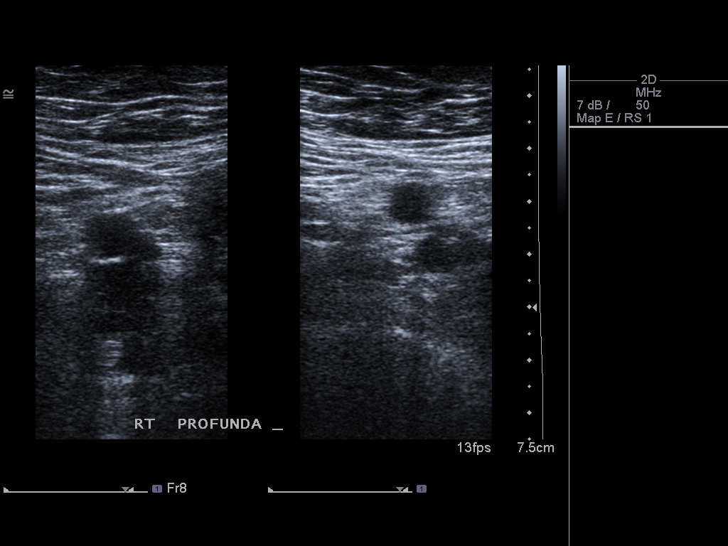
[im 10/36]
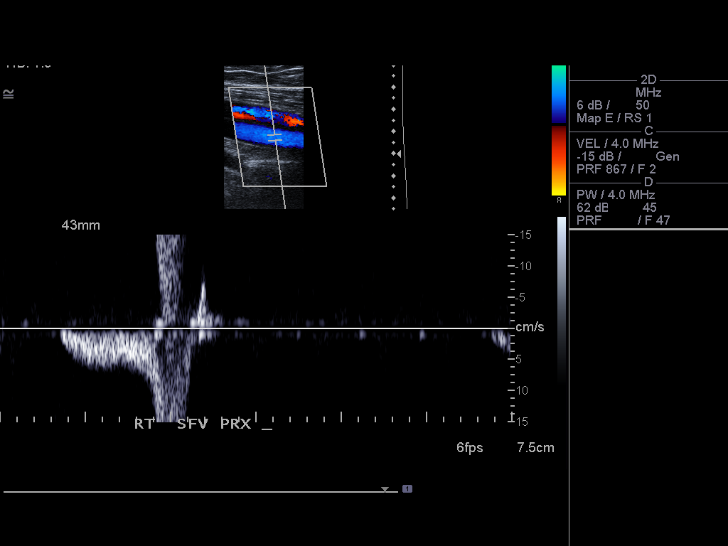
[im 11/36]
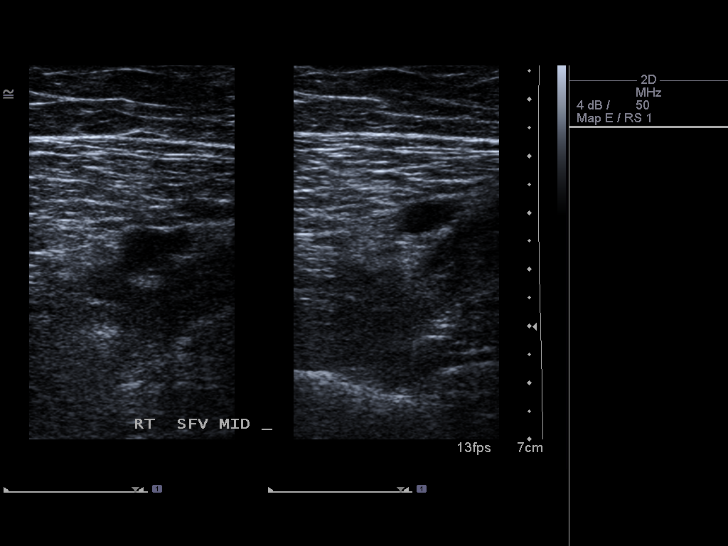
[im 14/36]
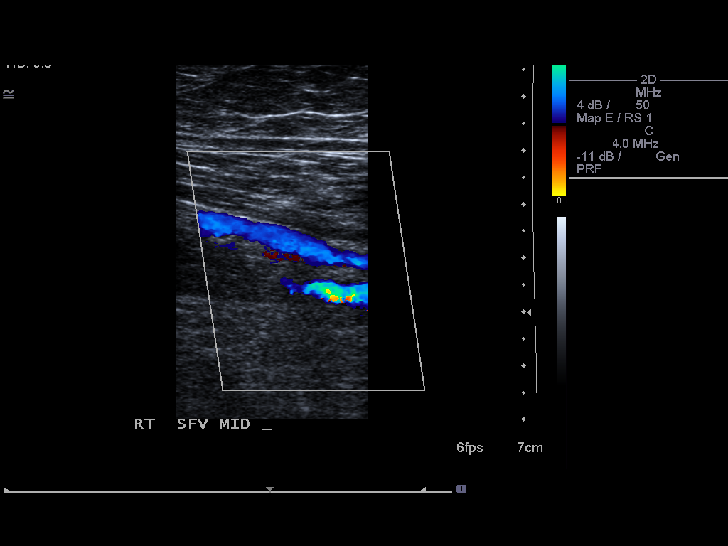
[im 17/36]
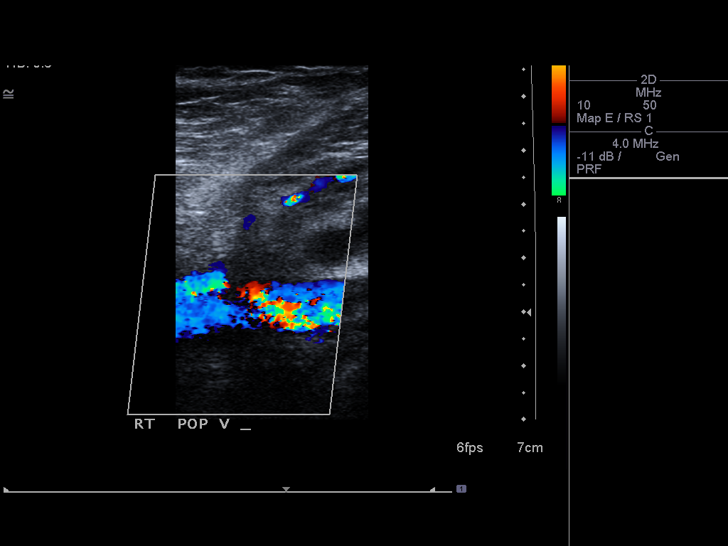
[im 19/36]
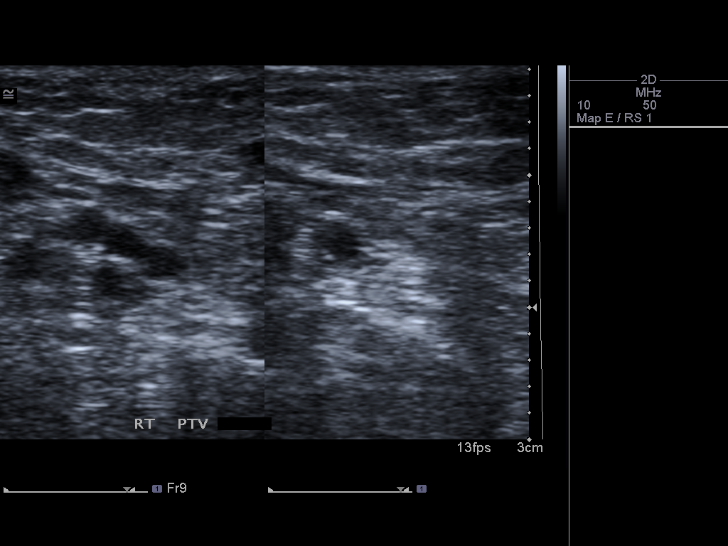
[im 22/36]
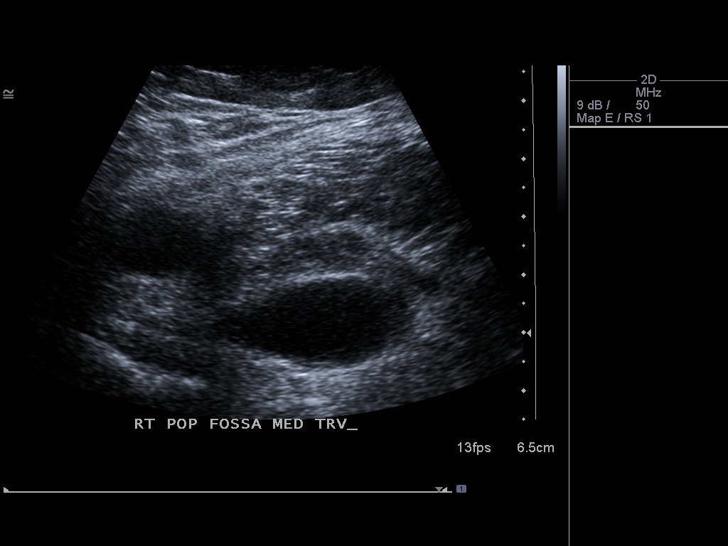
[im 25/36]
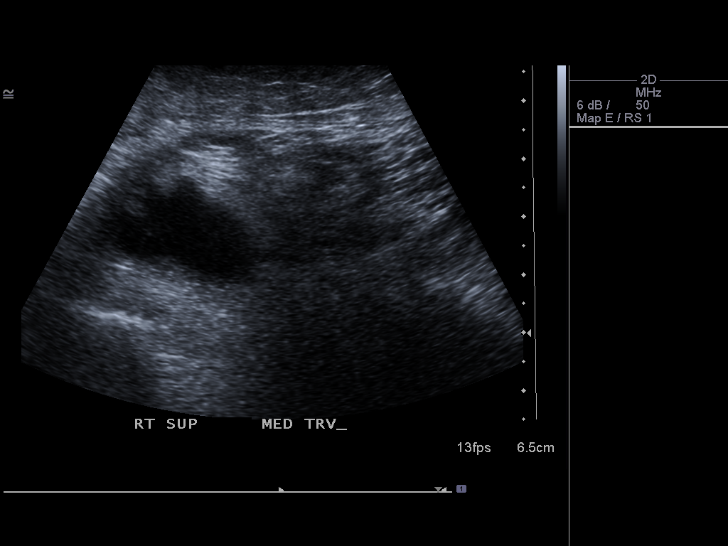
[im 28/36]
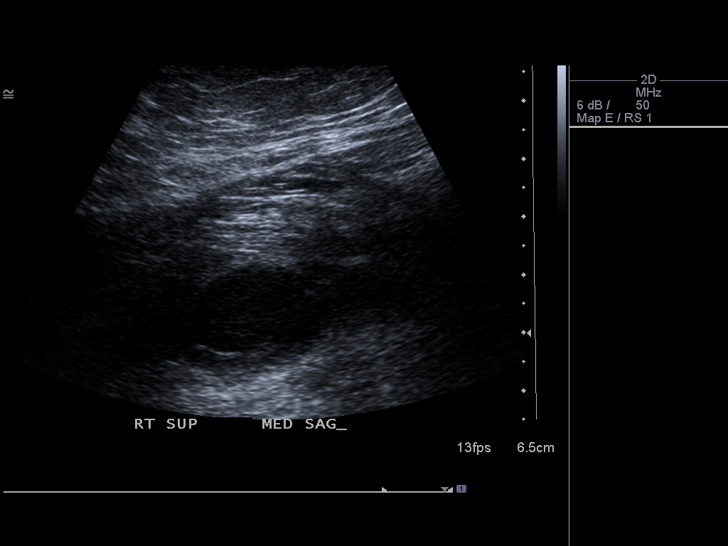
[im 29/36]
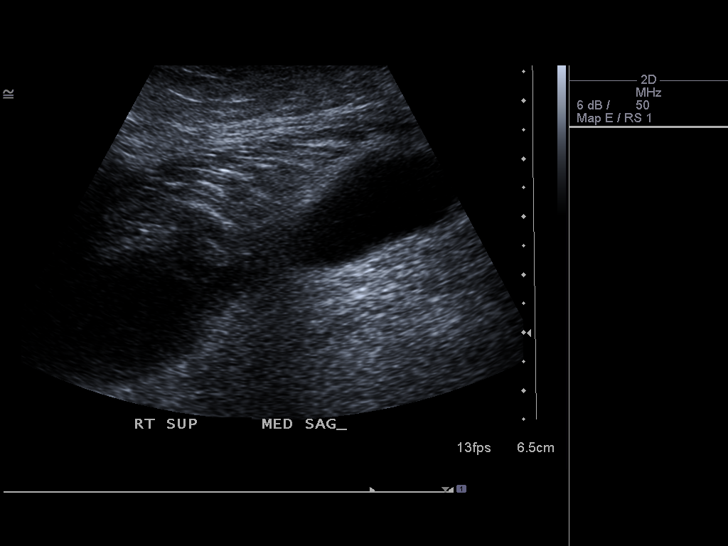
[im 32/36]
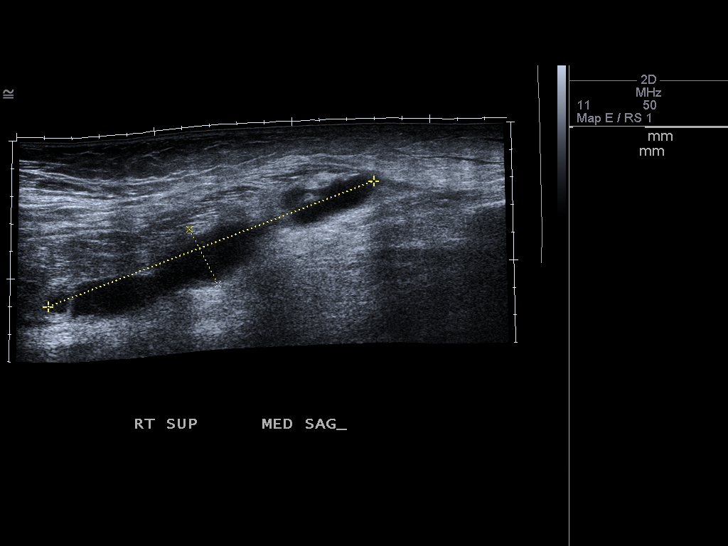
[im 36/36]
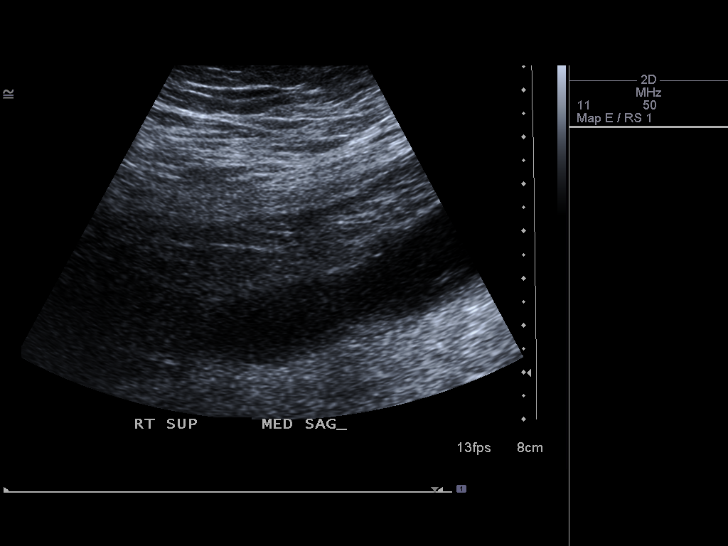

[14 of 24 positions shown; findings below may reference images not displayed]

FINDINGS: Normal compressibility and normal Doppler signal within
the common femoral, superficial femoral and popliteal veins, down
to the proximal calf veins.  No grayscale filling defects to
suggest DVT.

Elongated fluid collection in the right popliteal fossa extending
into the calf measuring 12.7 x 6.2 x 2.2 cm.
IMPRESSION: No evidence of right lower extremity deep vein thrombosis.

Right Baker's cyst.

## 2014-11-26 ENCOUNTER — Other Ambulatory Visit: Payer: Self-pay | Admitting: Orthopedic Surgery

## 2014-11-26 DIAGNOSIS — M545 Low back pain: Secondary | ICD-10-CM

## 2014-11-27 ENCOUNTER — Ambulatory Visit
Admission: RE | Admit: 2014-11-27 | Discharge: 2014-11-27 | Disposition: A | Discharge status: Home / Self Care | Payer: BLUE CROSS/BLUE SHIELD | Source: Ambulatory Visit | Attending: Orthopedic Surgery | Admitting: Orthopedic Surgery

## 2014-11-27 DIAGNOSIS — M545 Low back pain: Secondary | ICD-10-CM

## 2016-04-23 ENCOUNTER — Telehealth: Payer: Self-pay | Admitting: *Deleted

## 2016-04-23 NOTE — Telephone Encounter (Signed)
Attempted to contact the patient and left message for patient to call the office. Referral request received from [facility]. Need to know what rheumatologist patient has seen between 2014 to present. Will need records.

## 2016-05-28 ENCOUNTER — Ambulatory Visit: Payer: BLUE CROSS/BLUE SHIELD | Admitting: Family Medicine

## 2016-06-01 NOTE — Progress Notes (Signed)
Office Visit Note  Patient: Kerry Harrison             Date of Birth: Mar 23, 1971           MRN: [id number]             PCP: Benito Mccreedy, MD Referring: Gentry Fitz, MD Visit Date: 06/03/2016 Occupation: [employer] consultant    Subjective:  Pain in several joints.   History of Present Illness: EARON Harrison is a 46 y.o. male seen in consultation per request of Dr. Rip Harbour area and according to patient he started having recurrent knee joint effusion in his mid 35s. He had several aspirations of his knee joints. He also recalls that he had left elbow joint discomfort off and on to the point he developed a contracture in his left elbow. He's been having discomfort in his C-spine. One time he had nerve block for his lower back pain which was helpful. He recalls seeing me in November 2014 where we did x-rays and extensive lab work. At that time his labs showed monoclonal lambda gammopathy and he was referred to hematology. He does not recall seeing a hematologist and did not come for follow-up visits. Now he is been having pain in his bilateral hip joints and has difficulty lifting his legs. He has difficulty getting up from low chair. He knee joints continue to swell. The last aspiration of his right knee joint was 2 months ago. He states he has left knee joint arthroscopic surgery in 2016 and right knee joint arthroscopic surgery in 2017. He continues to have swelling in his ankles and his feet. He's been having discomfort in his hands and wrists joints as well. He has long-standing problems with her shoulders as he has dislocated his bilateral shoulders while playing high school football. Patient gives history of chronic diarrhea about to loose his stools every day. He denies any blood or mucus in his stool. Activities of Daily Living:  Patient reports morning stiffness for all day hours.   Patient Reports nocturnal pain.  Difficulty dressing/grooming: Reports Difficulty climbing  stairs: Reports Difficulty getting out of chair: Reports Difficulty using hands for taps, buttons, cutlery, and/or writing: Reports   Review of Systems  Constitutional: Positive for fatigue. Negative for night sweats and weakness ( ).  HENT: Negative for mouth sores, mouth dryness and nose dryness.   Eyes: Negative for redness and dryness.  Respiratory: Negative for shortness of breath and difficulty breathing.   Cardiovascular: Negative for chest pain, palpitations, hypertension, irregular heartbeat and swelling in legs/feet.  Gastrointestinal: Positive for diarrhea. Negative for constipation.  Endocrine: Negative for increased urination.  Musculoskeletal: Positive for arthralgias, joint pain, joint swelling and morning stiffness. Negative for myalgias, muscle weakness, muscle tenderness and myalgias.  Skin: Negative for color change, rash, hair loss, nodules/bumps, skin tightness, ulcers and sensitivity to sunlight.  Allergic/Immunologic: Negative for susceptible to infections.  Neurological: Negative for dizziness, fainting, memory loss and night sweats.  Hematological: Negative for swollen glands.  Psychiatric/Behavioral: Positive for depressed mood and sleep disturbance. The patient is not nervous/anxious.     PMFS History:  Patient Active Problem List   Diagnosis Date Noted  . Primary osteoarthritis of both knees 06/03/2016  . Rheumatoid arthritis of multiple sites with negative rheumatoid factor ([place]) 06/03/2016  . Abnormal SPEP 06/03/2016  . Elevated sed rate 06/03/2016    Past Medical History:  Diagnosis Date  . Anorectal fistula   . Arthritis     Family  History  Problem Relation Age of Onset  . Cancer Father     lung   Past Surgical History:  Procedure Laterality Date  . EVALUATION UNDER ANESTHESIA WITH ANAL FISTULECTOMY N/A 06/30/2012   Procedure: EXAM UNDER ANESTHESIA  fistulotomy, possible ;  Surgeon: Leighton Ruff, MD;  Location: [place];   Service: General;  Laterality: N/A;  . INCISION AND DRAINAGE PERIRECTAL ABSCESS  09/16/2011   Procedure: IRRIGATION AND DEBRIDEMENT PERIRECTAL ABSCESS;  Surgeon: Madilyn Hook, DO;  Location: [facility];  Service: General;  Laterality: N/A;   Social History   Social History Narrative  . No narrative on file     Objective: Vital Signs: BP 138/78   Pulse 82   Resp 16   Ht 6' 1.5" (1.867 m)   Wt 291 lb (132 kg)   BMI 37.87 kg/m    Physical Exam  Constitutional: He is oriented to person, place, and time. He appears well-developed and well-nourished.  HENT:  Head: Normocephalic and atraumatic.  Eyes: Conjunctivae and EOM are normal. Pupils are equal, round, and reactive to light.  Neck: Normal range of motion. Neck supple.  Cardiovascular: Normal rate, regular rhythm and normal heart sounds.   Pulmonary/Chest: Effort normal and breath sounds normal.  Abdominal: Soft. Bowel sounds are normal.  Neurological: He is alert and oriented to person, place, and time.  Skin: Skin is warm and dry. Capillary refill takes less than 2 seconds.  Psychiatric: He has a normal mood and affect. His behavior is normal.  Nursing note and vitals reviewed.    Musculoskeletal Exam: C-spine limitation with range of motion. Right shoulder joint good range of motion left shoulder joint and limited range of motion. He has some 20 contracture in his left elbow. Tenderness on palpation over bilateral elbow joints. Tenderness on palpation over bilateral wrist joints with right wrist joint extension 40 flexion 60 left wrist joint extension 45 flexion 60. He has synovitis over his right first MCP joint. He has some limited range of motion of bilateral hip joints right more so than the left. He has warmth and swelling in his bilateral knee joints. He is warmth and swelling in his bilateral ankle joints. He has swelling and synovitis in his right second MTP joint.  CDAI Exam: CDAI Homunculus Exam:   Tenderness:    RUE: wrist LUE: glenohumeral, ulnohumeral and radiohumeral and wrist Right hand: 1st MCP RLE: acetabulofemoral, tibiofemoral and tibiotalar LLE: acetabulofemoral, tibiofemoral and tibiotalar Right foot: 1st MTP, 2nd MTP, 4th MTP and 5th MTP Left foot: 1st MTP, 2nd MTP, 3rd MTP, 4th MTP and 5th MTP  Swelling:  LUE: ulnohumeral and radiohumeral Right hand: 1st MCP RLE: tibiofemoral and tibiotalar LLE: tibiofemoral and tibiotalar Right foot: 2nd MTP  Joint Counts:  CDAI Tender Joint count: 7 CDAI Swollen Joint count: 4  Global Assessments:  Patient Global Assessment: 8 Provider Global Assessment: 8  CDAI Calculated Score: 27    Investigation: Findings:  04/17/2016 synovial fluid left knee joint WBC 40,7 50, 93% neutrophils crystals negative 02/01/2013 CBC normal, CMP normal, ESR 47, TSH normal, RF negative, anti-CCP negative, ANA negative, ace negative, uric acid normal, HLA-B27 negative, hepatitis panel negative, HIV negative, immunoglobulins IgG elevated at 2540, SPEP monoclonal lambda gammopathy    Imaging: Xr Elbow 2 Views Left  Result Date: 06/03/2016 Elbow joint narrowing with some spurring was noted. No erosive changes were noted.  Xr Hip Unilat W Or W/o Pelvis 1v Left  Result Date: 06/03/2016 Moderate subcutaneous lateral narrowing  of the hip joint no chondrocalcinosis. Impression: these findings are consistent with moderate osteoarthritis of the hip joint  Xr Hip Unilat W Or W/o Pelvis 1v Right  Result Date: 06/03/2016 Moderate subcutaneous lateral narrowing of the hip joint no chondrocalcinosis. Impression: these findings are consistent with moderate osteoarthritis of the hip joint  Xr Foot 2 Views Left  Result Date: 06/03/2016 first MTP narrowing all PIP narrowing and DIP narrowing. No other MTP involvement was noted no erosive changes were noted no intertarsal joint space narrowing or erosive changes were noted. a small calcaneal spur was noted. Impression  these found findings were consistent with mild osteoarthritis of the foot  Xr Foot 2 Views Right  Result Date: 06/03/2016 Right first MTP narrowing all PIP narrowing and DIP narrowing. No other MTP involvement was noted no erosive changes were noted no intertarsal joint space narrowing or erosive changes were noted. Impression these found findings were consistent with mild osteoarthritis of the foot  Xr Hand 2 View Left  Result Date: 06/03/2016 Left third MCP minimal narrowing all PIP minimal narrowing no intercarpal joint space narrowing no erosive changes were noted. Impression these findings are consistent with osteoarthritis  Xr Hand 2 View Right  Result Date: 06/03/2016 Minimal narrowing of PIP joints. No intercarpal joint no MCP joint narrowing was noted no erosive changes were noted. Impression: Findings consistent with mild osteoarthritis  Xr Knee 3 View Left  Result Date: 06/03/2016 Moderate to severe medial compartment narrowing was noted. Moderate patellofemoral Narrowing was noted. No chondrocalcinosis was noted. Impression: Moderate osteoarthritis and moderate chondromalacia patella  Xr Knee 3 View Right  Result Date: 06/03/2016 Moderate medial compartment narrowing, moderate chondromalacia patella no chondrocalcinosis. Impression: Moderate osteoarthritis and moderate chondromalacia patella   Speciality Comments: No specialty comments available.    Procedures:  No procedures performed Allergies: Meloxicam and Morphine and related   Assessment / Plan:     Visit Diagnoses: Chronic pain of both knees - h/o recurrent effusions and aspiration. His last infusion was 2 months ago. He continues to have pain and swelling in his bilateral knee joints.  Primary osteoarthritis of both knees  Pain in bilateral hand -he has decreased range of motion of his bilateral wrist joints. He had synovitis over his right first MCP joint. Plan: XR Hand 2 View Right  Plan: XR Hand 2 View  Left  Pain in bilateral ankle and joints of left foot. He had swelling and synovitis over bilateral ankle joints and right second MTP joint. - Plan: XR Foot 2 Views Left - Plan: XR Foot 2 Views Right  Chronic pain of left knee - Plan: XR KNEE 3 VIEW LEFT  Chronic pain of right knee - Plan: XR KNEE 3 VIEW RIGHT  Pain in bilateral hip - Plan: XR HIP UNILAT W OR W/O PELVIS 1V RIGHT- Plan: XR HIP UNILAT W OR W/O PELVIS 1V LEFT. His difficulty walking and getting up from chair he has limited range of motion of his bilateral hip joints  Pain in left elbow and contracture - Plan: XR Elbow 2 Views Left   He has inflammatory arthritis involving multiple joints serology has been negative in the past. Although his sedimentation rate was elevated. 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 He did not come for follow-up visit after his last visit. We also referred him to hematology for abnormal SPEP which she did not keep the appointment for. Will refer him to hematology again. My plan  is to start him on a prednisone taper 20 mg for a week, 15 for a week, 10 for a week, 5 for a week, 2.5 mg for a week. Hopefully that'll be sufficient bridging therapy. I'm leaning towards the starting him on methotrexate due to severe nature of his arthritis. Indications side effects contraindications were discussed at length today by Dr. Koleen Nimrod in May. We will take a written consent from him. I'm inclined towards the starting on subcutaneous methotrexate for faster action. He will also take folic acid along with that. Is requesting a handicap sticker as he is having difficulty walking due to his job. I'll fill that. I'll obtain following labs today.   Orders: Orders Placed This Encounter  Procedures  . XR Hand 2 View Right  . XR Hand 2 View Left  . XR Foot 2 Views Left  . XR Foot 2 Views Right  . XR KNEE 3 VIEW RIGHT  . XR KNEE 3 VIEW LEFT  . XR HIP UNILAT W OR  W/O PELVIS 1V LEFT  . XR HIP UNILAT W OR W/O PELVIS 1V RIGHT  . XR Elbow 2 Views Left  . DG Chest 2 View  . CBC with Differential/Platelet  . COMPLETE METABOLIC PANEL WITH GFR  . Urinalysis, Routine w reflex microscopic  . Sedimentation rate  . Serum protein electrophoresis with reflex  . Quantiferon tb gold assay (blood)  . Rheumatoid Factor  . Cyclic citrul peptide antibody, IgG  . 14-3-3 eta Protein  . Ambulatory referral to Hematology   Meds ordered this encounter  Medications  . predniSONE (DELTASONE) 5 MG tablet    Sig: 4 tab q am x7days, 3 tab q am x7d, 2 tab q amx7 days, 1 tab q am x7d ,1/2 tab qam x7d then  d/c    Dispense:  74 tablet    Refill:  0    Face-to-face time spent with patient was 60 minutes. 50% of time was spent in counseling and coordination of care.  Follow-Up Instructions: Return for Inflammatory arthritis, Rheumatoid arthritis.   Bo Merino, MD  Note - This record has been created using Editor, commissioning.  Chart creation errors have been sought, but may not always  have been located. Such creation errors do not reflect on  the standard of medical care.

## 2016-06-03 ENCOUNTER — Ambulatory Visit (INDEPENDENT_AMBULATORY_CARE_PROVIDER_SITE_OTHER): Payer: Self-pay

## 2016-06-03 ENCOUNTER — Encounter: Payer: Self-pay | Admitting: Rheumatology

## 2016-06-03 ENCOUNTER — Ambulatory Visit (INDEPENDENT_AMBULATORY_CARE_PROVIDER_SITE_OTHER): Payer: BLUE CROSS/BLUE SHIELD | Admitting: Rheumatology

## 2016-06-03 VITALS — BP 138/78 | HR 82 | Resp 16 | Ht 73.5 in | Wt 291.0 lb

## 2016-06-03 DIAGNOSIS — M79642 Pain in left hand: Secondary | ICD-10-CM | POA: Diagnosis not present

## 2016-06-03 DIAGNOSIS — M25551 Pain in right hip: Secondary | ICD-10-CM

## 2016-06-03 DIAGNOSIS — M170 Bilateral primary osteoarthritis of knee: Secondary | ICD-10-CM

## 2016-06-03 DIAGNOSIS — M25561 Pain in right knee: Secondary | ICD-10-CM

## 2016-06-03 DIAGNOSIS — M25572 Pain in left ankle and joints of left foot: Secondary | ICD-10-CM | POA: Diagnosis not present

## 2016-06-03 DIAGNOSIS — G8929 Other chronic pain: Secondary | ICD-10-CM

## 2016-06-03 DIAGNOSIS — R700 Elevated erythrocyte sedimentation rate: Secondary | ICD-10-CM

## 2016-06-03 DIAGNOSIS — M25571 Pain in right ankle and joints of right foot: Secondary | ICD-10-CM | POA: Diagnosis not present

## 2016-06-03 DIAGNOSIS — R778 Other specified abnormalities of plasma proteins: Secondary | ICD-10-CM

## 2016-06-03 DIAGNOSIS — M25562 Pain in left knee: Secondary | ICD-10-CM | POA: Diagnosis not present

## 2016-06-03 DIAGNOSIS — M25552 Pain in left hip: Secondary | ICD-10-CM

## 2016-06-03 DIAGNOSIS — M79641 Pain in right hand: Secondary | ICD-10-CM | POA: Diagnosis not present

## 2016-06-03 DIAGNOSIS — M25522 Pain in left elbow: Secondary | ICD-10-CM | POA: Diagnosis not present

## 2016-06-03 DIAGNOSIS — Z111 Encounter for screening for respiratory tuberculosis: Secondary | ICD-10-CM

## 2016-06-03 DIAGNOSIS — M0609 Rheumatoid arthritis without rheumatoid factor, multiple sites: Secondary | ICD-10-CM

## 2016-06-03 LAB — CBC WITH DIFFERENTIAL/PLATELET
Basophils Absolute: 0 cells/uL (ref 0–200)
Basophils Relative: 0 %
EOS ABS: 85 {cells}/uL (ref 15–500)
Eosinophils Relative: 1 %
HCT: 44.8 % (ref 38.5–50.0)
Hemoglobin: 14.9 g/dL (ref 13.2–17.1)
Lymphocytes Relative: 24 %
Lymphs Abs: 2040 cells/uL (ref 850–3900)
MCH: 27 pg (ref 27.0–33.0)
MCHC: 33.3 g/dL (ref 32.0–36.0)
MCV: 81.3 fL (ref 80.0–100.0)
MONOS PCT: 8 %
MPV: 9 fL (ref 7.5–12.5)
Monocytes Absolute: 680 cells/uL (ref 200–950)
Neutro Abs: 5695 cells/uL (ref 1500–7800)
Neutrophils Relative %: 67 %
Platelets: 378 10*3/uL (ref 140–400)
RBC: 5.51 MIL/uL (ref 4.20–5.80)
RDW: 16.6 % — ABNORMAL HIGH (ref 11.0–15.0)
WBC: 8.5 10*3/uL (ref 3.8–10.8)

## 2016-06-03 LAB — COMPLETE METABOLIC PANEL WITH GFR
ALT: 13 U/L (ref 9–46)
AST: 15 U/L (ref 10–40)
Albumin: 4 g/dL (ref 3.6–5.1)
Alkaline Phosphatase: 76 U/L (ref 40–115)
BILIRUBIN TOTAL: 0.6 mg/dL (ref 0.2–1.2)
BUN: 11 mg/dL (ref 7–25)
CO2: 24 mmol/L (ref 20–31)
Calcium: 9.7 mg/dL (ref 8.6–10.3)
Chloride: 102 mmol/L (ref 98–110)
Creat: 0.89 mg/dL (ref 0.60–1.35)
GFR, Est African American: >89 mL/min (ref >=60)
GFR, Est Non African American: >89 mL/min (ref >=60)
GLUCOSE: 98 mg/dL (ref 65–99)
Potassium: 4.4 mmol/L (ref 3.5–5.3)
Sodium: 137 mmol/L (ref 135–146)
TOTAL PROTEIN: 8.4 g/dL — AB (ref 6.1–8.1)

## 2016-06-03 MED ORDER — PREDNISONE 5 MG PO TABS: ORAL_TABLET | ORAL | 0 refills | Status: DC

## 2016-06-03 NOTE — Progress Notes (Signed)
Pharmacy Note  Subjective: Patient presents today to the [facility] to see Dr. Estanislado Pandy.  Patient seen by the pharmacist for counseling on methotrexate.    Objective: CBC, CMP ordered today TB Gold: ordered today Hepatitis panel: negative (02/01/2013) HIV: negative (02/01/2013)  Chest-xray:  Ordered today  Contraception: Patient reports he had vasectomy  Assessment/Plan:  Patient was counseled on the purpose, proper use, and adverse effects of methotrexate including nausea, infection, and signs and symptoms of pneumonitis.  Reviewed dosing instructions of methotrexate weekly along with folic acid daily.  Discussed the importance of frequent monitoring of kidney and liver function and blood counts.  Counseled patient to avoid NSAIDs and alcohol while on methotrexate.  Provided patient with educational materials on methotrexate and answered all questions.  Advised patient to get annual influenza vaccine and to get a pneumococcal vaccine if patient has not already had one.  Patient voiced understanding.  Patient consented to methotrexate use.  Will upload into chart.    Patient initially expressed interest in using subcutaneous methotrexate.  Educated patient on how to use a vial and syringe and reviewed injection technique with patient.  Provided patient on educational material regarding injection technique and storage of methotrexate.  Patient then decided that he would prefer to use methotrexate tablets instead of subcutaneous injections.  Patient is aware that methotrexate will not be started at this time as we have to get baseline labs and chest x-ray.    Elisabeth Most, Pharm.D., BCPS Clinical Pharmacist Pager: [phone] Phone: [phone] 06/03/2016 9:55 AM

## 2016-06-03 NOTE — Patient Instructions (Addendum)
See your primary care regarding your need for pneumonia vaccine, you will need this before starting any immunosuppressants. You will get a call from hematology for appointment.  Take the prednisone as directed until it is gone. It can raise your blood pressure and you blood sugar. If you have elevation of your blood pressure, see your primary care.   Go to [facility] for the Chest xray you do not need an appointment.       Methotrexate subcutaneous injection What is this medicine? METHOTREXATE (METH oh TREX ate) is a cytotoxic drug that also suppresses the immune system. It is used to treat psoriasis and rheumatoid arthritis. This medicine may be used for other purposes; ask your health care provider or pharmacist if you have questions. COMMON BRAND NAME(S): Otrexup, Rasuvo What should I tell my health care provider before I take this medicine? They need to know if you have any of these conditions: -fluid in the stomach area or lungs -if you often drink alcohol -infection or immune system problems -kidney disease -liver disease -low blood counts, like low white cell, platelet, or red cell counts -lung disease -radiation therapy -stomach ulcers -ulcerative colitis -an unusual or allergic reaction to methotrexate, other medicines, foods, dyes, or preservatives -pregnant or trying to get pregnant -breast-feeding How should I use this medicine? This medicine is for injection under the skin. You will be taught how to prepare and give this medicine. Refer to the Instructions for Use that come with your medication packaging. Use exactly as directed. Take your medicine at regular intervals. Do not take your medicine more often than directed. This medicine should be taken weekly, NOT daily. It is important that you put your used needles and syringes in a special sharps container. Do not put them in a trash can. If you do not have a sharps container, call your pharmacist of healthcare  provider to get one. Talk to your pediatrician regarding the use of this medicine in children. While this drug may be prescribed for children as young as 2 years for selected conditions, precautions do apply. Overdosage: If you think you have taken too much of this medicine contact a poison control center or emergency room at once. NOTE: This medicine is only for you. Do not share this medicine with others. What if I miss a dose? If you are not sure if this medicine was injected or if you have a hard time giving the injection, do not inject another dose. Talk with your doctor or health care professional. What may interact with this medicine? This medicine may interact with the following medications: -acitretin -aspirin or aspirin-like medicines including salicylates -azathioprine -certain antibiotics like chloramphenicol, penicillin, tetracycline -cyclosporine -gold -hydroxychloroquine -live virus vaccines -mercaptopurine -NSAIDs, medicines for pain and inflammation, like ibuprofen or naproxen -other cytotoxic agents -penicillamine -phenylbutazone -phenytoin -probenacid -retinoids such as isotretinoin and tretinoin -steroid medicines like prednisone or cortisone -sulfonamides like sulfasalazine and trimethoprim/sulfamethoxazole -theophylline This list may not describe all possible interactions. Give your health care provider a list of all the medicines, herbs, non-prescription drugs, or dietary supplements you use. Also tell them if you smoke, drink alcohol, or use illegal drugs. Some items may interact with your medicine. What should I watch for while using this medicine? Avoid alcoholic drinks. This medicine can make you more sensitive to the sun. Keep out of the sun. If you cannot avoid being in the sun, wear protective clothing and use sunscreen. Do not use sun lamps or tanning beds/booths. You  may get drowsy or dizzy. Do not drive, use machinery, or do anything that needs mental  alertness until you know how this medicine affects you. Do not stand or sit up quickly, especially if you are an older patient. This reduces the risk of dizzy or fainting spells. You may need blood work done while you are taking this medicine. Call your doctor or health care professional for advice if you get a fever, chills or sore throat, or other symptoms of a cold or flu. Do not treat yourself. This drug decreases your body's ability to fight infections. Try to avoid being around people who are sick. This medicine may increase your risk to bruise or bleed. Call your doctor or health care professional if you notice any unusual bleeding. Check with your doctor or health care professional if you get an attack of severe diarrhea, nausea and vomiting, or if you sweat a lot. The loss of too much body fluid can make it dangerous for you to take this medicine. Talk to your doctor about your risk of cancer. You may be more at risk for certain types of cancers if you take this medicine. Both men and women must use effective birth control with this medicine. Do not become pregnant while taking this medicine or until at least 1 normal menstrual cycle has occurred after stopping it. Women should inform their doctor if they wish to become pregnant or think they might be pregnant. Men should not father a child while taking this medicine and for 3 months after stopping it. There is a potential for serious side effects to an unborn child. Talk to your health care professional or pharmacist for more information. Do not breast-feed an infant while taking this medicine. What side effects may I notice from receiving this medicine? Side effects that you should report to your doctor or health care professional as soon as possible: -allergic reactions like skin rash, itching or hives, swelling of the face, lips, or tongue -breathing problems or shortness of breath -diarrhea -dry, nonproductive cough -low blood counts -  this medicine may decrease the number of white blood cells, red blood cells and platelets. You may be at increased risk of infections and bleeding -mouth sores -redness, blistering, peeling or loosening of the skin, including inside the mouth -signs of infection - fever or chills, cough, sore throat, pain or difficulty passing urine -signs and symptoms of bleeding such as bloody or black, tarry stools; red or dark-brown urine; spitting up blood or brown material that looks like coffee grounds; red spots on the skin; unusual bruising or bleeding from the eye, gums, or nose -signs and symptoms of kidney injury like trouble passing urine or change in the amount of urine -signs and symptoms of liver injury like dark yellow or brown urine; general ill feeling or flu-like symptoms; light-colored stools; loss of appetite; nausea; right upper belly pain; unusually weak or tired; yellowing of the eyes or skin Side effects that usually do not require medical attention (report to your doctor or health care professional if they continue or are bothersome): -dizziness -hair loss -headache -stomach pain -upset stomach -vomiting This list may not describe all possible side effects. Call your doctor for medical advice about side effects. You may report side effects to FDA at 1-800-FDA-1088. Where should I keep my medicine? Keep out of the reach of children. You will be instructed on how to store this medicine. Throw away any unused medicine after the expiration date on the label.  NOTE: This sheet is a summary. It may not cover all possible information. If you have questions about this medicine, talk to your doctor, pharmacist, or health care provider.  2018 Elsevier/Gold Standard (2015-04-18 11:50:46)

## 2016-06-04 LAB — URINALYSIS, MICROSCOPIC ONLY
Bacteria, UA: NONE SEEN [HPF]
CASTS: NONE SEEN [LPF]
RBC / HPF: NONE SEEN RBC/HPF (ref <=2)
WBC, UA: NONE SEEN WBC/HPF (ref <=5)
YEAST: NONE SEEN [HPF]

## 2016-06-04 LAB — SEDIMENTATION RATE: Sed Rate: 30 mm/hr — ABNORMAL HIGH (ref 0–15)

## 2016-06-04 LAB — URINALYSIS, ROUTINE W REFLEX MICROSCOPIC
Bilirubin Urine: NEGATIVE
GLUCOSE, UA: NEGATIVE
HGB URINE DIPSTICK: NEGATIVE
Leukocytes, UA: NEGATIVE
Nitrite: NEGATIVE
SPECIFIC GRAVITY, URINE: 1.028 (ref 1.001–1.035)
pH: 5.5 (ref 5.0–8.0)

## 2016-06-04 LAB — RHEUMATOID FACTOR: Rhuematoid fact SerPl-aCnc: <14 IU/mL (ref <14)

## 2016-06-04 LAB — CYCLIC CITRUL PEPTIDE ANTIBODY, IGG: Cyclic Citrullin Peptide Ab: <16 Units

## 2016-06-05 LAB — QUANTIFERON TB GOLD ASSAY (BLOOD)
Interferon Gamma Release Assay: NEGATIVE
Mitogen-Nil: >10 IU/mL
QUANTIFERON NIL VALUE: 0.02 [IU]/mL
QUANTIFERON TB AG MINUS NIL: 0 [IU]/mL

## 2016-06-06 LAB — 14-3-3 ETA PROTEIN: 14-3-3 eta Protein: <0.2 ng/mL (ref <0.2)

## 2016-06-08 LAB — PROTEIN ELECTROPHORESIS, SERUM, WITH REFLEX
ALBUMIN ELP: 3.9 g/dL (ref 3.8–4.8)
ALPHA-1-GLOBULIN: 0.5 g/dL — AB (ref 0.2–0.3)
Abnormal Protein Band1: 1 g/dL
Alpha-2-Globulin: 1 g/dL — ABNORMAL HIGH (ref 0.5–0.9)
BETA 2: 0.5 g/dL (ref 0.2–0.5)
Beta Globulin: 0.4 g/dL (ref 0.4–0.6)
Gamma Globulin: 1.9 g/dL — ABNORMAL HIGH (ref 0.8–1.7)
Total Protein, Serum Electrophoresis: 8.4 g/dL — ABNORMAL HIGH (ref 6.1–8.1)

## 2016-06-08 LAB — IFE INTERPRETATION

## 2016-06-08 NOTE — Progress Notes (Signed)
Labs normal.

## 2016-06-08 NOTE — Progress Notes (Signed)
Add IFE

## 2016-06-09 ENCOUNTER — Telehealth: Payer: Self-pay | Admitting: Rheumatology

## 2016-06-09 NOTE — Telephone Encounter (Signed)
Please inform patient about abnormal IFE and that we will be referring him to hematology for evaluation. Please refer him to hematology. It should be okay for him to start methotrexate.

## 2016-06-09 NOTE — Telephone Encounter (Signed)
Patient advised we have placed a hematology referral due to abnormal IFE. Patient also advised that before we can start the MTX he will need to have a chest x-ray. Patient verbalized understanding.

## 2016-06-09 NOTE — Telephone Encounter (Signed)
Attempted to contact the patient and left message for patient to call the office.  

## 2016-06-09 NOTE — Telephone Encounter (Signed)
Reviewed patient's chart.  Noted he has not had his chest x-ray yet.  Please inform patient that he will need chest x-ray before initiation of methotrexate.

## 2016-06-13 ENCOUNTER — Ambulatory Visit (HOSPITAL_COMMUNITY)
Admission: RE | Admit: 2016-06-13 | Discharge: 2016-06-13 | Disposition: A | Discharge status: Home / Self Care | Payer: BLUE CROSS/BLUE SHIELD | Source: Ambulatory Visit | Attending: Rheumatology | Admitting: Rheumatology

## 2016-06-13 DIAGNOSIS — Z111 Encounter for screening for respiratory tuberculosis: Secondary | ICD-10-CM | POA: Diagnosis present

## 2016-06-13 DIAGNOSIS — M0609 Rheumatoid arthritis without rheumatoid factor, multiple sites: Secondary | ICD-10-CM

## 2016-06-14 NOTE — Progress Notes (Signed)
CXR normal.

## 2016-06-15 ENCOUNTER — Telehealth: Payer: Self-pay | Admitting: Rheumatology

## 2016-06-15 NOTE — Telephone Encounter (Signed)
MTX 6 tabs po qwk with Folic acid 64m po qd, increase every 2 weeks to 8tabs po qwk if labs stay normal.CBC, CMP q 2wks x2 then q 22ms.

## 2016-06-15 NOTE — Telephone Encounter (Signed)
Labs have been completed and chest x ray is normal. What dose of MTX do you want to start him on?

## 2016-06-15 NOTE — Telephone Encounter (Signed)
Patient called to advise that he had his chest x-ray on Saturday.  CB#[phone].  Thank you

## 2016-06-16 MED ORDER — METHOTREXATE 2.5 MG PO TABS: ORAL_TABLET | ORAL | 2 refills | Status: DC

## 2016-06-16 MED ORDER — FOLIC ACID 1 MG PO TABS: 2.0000 mg | ORAL_TABLET | Freq: Every day | ORAL | 4 refills | Status: DC

## 2016-06-16 NOTE — Telephone Encounter (Signed)
It will take some 5 time for medication to work.he is on prednisone taper as well.

## 2016-06-16 NOTE — Telephone Encounter (Signed)
Patient aware of chest x ray results. Prescriptions sent to the pharmacy and patient advised. Aware of lab schedule and verbalized understanding. Patient states he is taking the prednisone and still waking up really sore in the morning. He states he is in the bed today because he is hurting.

## 2016-06-16 NOTE — Addendum Note (Signed)
Addended by: Carole Binning on: 06/16/2016 08:44 AM   Modules accepted: Orders

## 2016-06-17 ENCOUNTER — Telehealth: Payer: Self-pay | Admitting: Rheumatology

## 2016-06-17 NOTE — Telephone Encounter (Signed)
Patient states he is having swelling and pain in his left foot, left ankle and left knee and right hip. Patient is on a prednisone taper and has started MTX. Patient has requested something for pain. Patient states he is at work and walks all day on concrete is having trouble. Advise patient he could take Tylenol but no more than 3 grams per day. Patient verbalized understanding.

## 2016-06-17 NOTE — Telephone Encounter (Signed)
Patient is requesting pain Rx for L foot , L knee and R hip.  He is at work today with extreme swelling. Please call and advise.

## 2016-06-23 ENCOUNTER — Telehealth: Payer: Self-pay | Admitting: Rheumatology

## 2016-06-23 NOTE — Telephone Encounter (Signed)
How do you want [place] to follow up?

## 2016-06-23 NOTE — Telephone Encounter (Signed)
Patient calling because he has swelling in both feet and the L knee.  The L knee is hot to the touch.  Has NPT f/u April 17th.  Please call and advise

## 2016-06-23 NOTE — Telephone Encounter (Signed)
Increase Prednisone to 20 mg po qd x week then taper by 5 mg po q week . He should keep appointment with hematology as well.

## 2016-06-24 NOTE — Telephone Encounter (Signed)
Patient advised to increase his prednisone to 20 mg daily for a week then taper by 5 mg weekly. Patient advised to keep his follow up with hematology. Patient states he does not have an appointment schedule yet, Advise will have Ivin Booty check on that for him. Patient is also question possible short term disability through his job because of all of the swelling and issues he is having. He would like to know if this would be an option.

## 2016-07-06 NOTE — Progress Notes (Deleted)
Office Visit Note  Patient: Kerry Harrison             Date of Birth: Dec 20, 1970           MRN: [id number]             PCP: Benito Mccreedy, MD Referring: Benito Mccreedy, MD Visit Date: 07/14/2016 Occupation: @GUAROCC @    Subjective:  No chief complaint on file.   History of Present Illness: Kerry Harrison is a 46 y.o. male ***   Activities of Daily Living:  Patient reports morning stiffness for *** {minute/hour:19697}.   Patient {ACTIONS;DENIES/REPORTS:21021675::"Denies"} nocturnal pain.  Difficulty dressing/grooming: {ACTIONS;DENIES/REPORTS:21021675::"Denies"} Difficulty climbing stairs: {ACTIONS;DENIES/REPORTS:21021675::"Denies"} Difficulty getting out of chair: {ACTIONS;DENIES/REPORTS:21021675::"Denies"} Difficulty using hands for taps, buttons, cutlery, and/or writing: {ACTIONS;DENIES/REPORTS:21021675::"Denies"}   No Rheumatology ROS completed.   PMFS History:  Patient Active Problem List   Diagnosis Date Noted  . Primary osteoarthritis of both knees 06/03/2016  . Rheumatoid arthritis of multiple sites with negative rheumatoid factor ([place]) 06/03/2016  . Abnormal SPEP 06/03/2016  . Elevated sed rate 06/03/2016    Past Medical History:  Diagnosis Date  . Anorectal fistula   . Arthritis     Family History  Problem Relation Age of Onset  . Cancer Father     lung   Past Surgical History:  Procedure Laterality Date  . EVALUATION UNDER ANESTHESIA WITH ANAL FISTULECTOMY N/A 06/30/2012   Procedure: EXAM UNDER ANESTHESIA  fistulotomy, possible ;  Surgeon: Leighton Ruff, MD;  Location: [facility];  Service: General;  Laterality: N/A;  . INCISION AND DRAINAGE PERIRECTAL ABSCESS  09/16/2011   Procedure: IRRIGATION AND DEBRIDEMENT PERIRECTAL ABSCESS;  Surgeon: Madilyn Hook, DO;  Location: [facility];  Service: General;  Laterality: N/A;   Social History   Social History Narrative  . No narrative on file     Objective: Vital Signs: There were no vitals  taken for this visit.   Physical Exam   Musculoskeletal Exam: ***  CDAI Exam: No CDAI exam completed.    Investigation: Findings:  06/03/2016 CBC normal, CMP total protein 8.4, UA 1+ protein, ESR 30, RF negative, anti-CCP negative, 14 33 eta negative, TB gold negative, IFE positive for IgG Monoclonal Band    No visits with results within 2 Week(s) from this visit.  Latest known visit with results is:  Office Visit on 06/03/2016  Component Date Value Ref Range Status  . WBC 06/03/2016 8.5  3.8 - 10.8 K/uL Final  . RBC 06/03/2016 5.51  4.20 - 5.80 MIL/uL Final  . Hemoglobin 06/03/2016 14.9  13.2 - 17.1 g/dL Final  . HCT 06/03/2016 44.8  38.5 - 50.0 % Final  . MCV 06/03/2016 81.3  80.0 - 100.0 fL Final  . MCH 06/03/2016 27.0  27.0 - 33.0 pg Final  . MCHC 06/03/2016 33.3  32.0 - 36.0 g/dL Final  . RDW 06/03/2016 16.6* 11.0 - 15.0 % Final  . Platelets 06/03/2016 378  140 - 400 K/uL Final  . MPV 06/03/2016 9.0  7.5 - 12.5 fL Final  . Neutro Abs 06/03/2016 5695  1,500 - 7,800 cells/uL Final  . Lymphs Abs 06/03/2016 2040  850 - 3,900 cells/uL Final  . Monocytes Absolute 06/03/2016 680  200 - 950 cells/uL Final  . Eosinophils Absolute 06/03/2016 85  15 - 500 cells/uL Final  . Basophils Absolute 06/03/2016 0  0 - 200 cells/uL Final  . Neutrophils Relative % 06/03/2016 67  % Final  . Lymphocytes Relative 06/03/2016 24  % Final  .  Monocytes Relative 06/03/2016 8  % Final  . Eosinophils Relative 06/03/2016 1  % Final  . Basophils Relative 06/03/2016 0  % Final  . Smear Review 06/03/2016 Criteria for review not met   Final  . Sodium 06/03/2016 137  135 - 146 mmol/L Final  . Potassium 06/03/2016 4.4  3.5 - 5.3 mmol/L Final  . Chloride 06/03/2016 102  98 - 110 mmol/L Final  . CO2 06/03/2016 24  20 - 31 mmol/L Final  . Glucose, Bld 06/03/2016 98  65 - 99 mg/dL Final  . BUN 06/03/2016 11  7 - 25 mg/dL Final  . Creat 06/03/2016 0.89  0.60 - 1.35 mg/dL Final  . Total Bilirubin 06/03/2016  0.6  0.2 - 1.2 mg/dL Final  . Alkaline Phosphatase 06/03/2016 76  40 - 115 U/L Final  . AST 06/03/2016 15  10 - 40 U/L Final  . ALT 06/03/2016 13  9 - 46 U/L Final  . Total Protein 06/03/2016 8.4* 6.1 - 8.1 g/dL Final  . Albumin 06/03/2016 4.0  3.6 - 5.1 g/dL Final  . Calcium 06/03/2016 9.7  8.6 - 10.3 mg/dL Final  . GFR, Est African American 06/03/2016 >89  >=60 mL/min Final  . GFR, Est Non African American 06/03/2016 >89  >=60 mL/min Final  . Color, Urine 06/03/2016 DARK YELLOW  YELLOW Final  . APPearance 06/03/2016 TURBID* CLEAR Final  . Specific Gravity, Urine 06/03/2016 1.028  1.001 - 1.035 Final  . pH 06/03/2016 5.5  5.0 - 8.0 Final  . Glucose, UA 06/03/2016 NEGATIVE  NEGATIVE Final  . Bilirubin Urine 06/03/2016 NEGATIVE  NEGATIVE Final  . Ketones, ur 06/03/2016 TRACE* NEGATIVE Final  . Hgb urine dipstick 06/03/2016 NEGATIVE  NEGATIVE Final  . Protein, ur 06/03/2016 1+* NEGATIVE Final  . Nitrite 06/03/2016 NEGATIVE  NEGATIVE Final  . Leukocytes, UA 06/03/2016 NEGATIVE  NEGATIVE Final  . Sed Rate 06/03/2016 30* 0 - 15 mm/hr Final  . Total Protein, Serum Electrophores* 06/03/2016 8.4* 6.1 - 8.1 g/dL Final  . Albumin ELP 06/03/2016 3.9  3.8 - 4.8 g/dL Final  . Alpha-1-Globulin 06/03/2016 0.5* 0.2 - 0.3 g/dL Final  . Alpha-2-Globulin 06/03/2016 1.0* 0.5 - 0.9 g/dL Final  . Beta Globulin 06/03/2016 0.4  0.4 - 0.6 g/dL Final  . Beta 2 06/03/2016 0.5  0.2 - 0.5 g/dL Final  . Gamma Globulin 06/03/2016 1.9* 0.8 - 1.7 g/dL Final  . Abnormal Protein Band1 06/03/2016 1.0  g/dL Final  . SPE Interp. 06/03/2016 SEE NOTE   Final   Comment: Evaluation reveals a restricted band (M-spike) migrating in the gamma globulin region. Consider immunofixation analysis if indicated. Reflex testing will be performed as indicated. Reviewed by Odis Hollingshead, MD, PhD, FCAP (Electronic Signature on File)   . Abnormal Protein Band2 06/03/2016 NOT DET  g/dL Final  . Abnormal Protein Band3 06/03/2016 NOT  DET  g/dL Final  . Interferon Gamma Release Assay 06/03/2016 NEGATIVE  NEGATIVE Final  . Quantiferon Nil Value 06/03/2016 0.02  IU/mL Final  . Mitogen-Nil 06/03/2016 >10.00  IU/mL Final  . Quantiferon Tb Ag Minus Nil Value 06/03/2016 0.00  IU/mL Final   Comment:   The Nil tube value is used to determine if the patient has a preexisting immune response which could cause a false-positive reading on the test. In order for a test to be valid, the Nil tube must have a value of less than or equal to 8.0 IU/mL.   The mitogen control tube is used to assure  the patient has a healthy immune status and also serves as a control for correct blood handling and incubation. It is used to detect false-negative readings. The mitogen tube must have a gamma interferon value of greater than or equal to 0.5 IU/mL higher than the value of the Nil tube.   The TB antigen tube is coated with the M. tuberculosis specific antigens. For a test to be considered positive, the TB antigen tube value minus the Nil tube value must be greater than or equal to 0.35 IU/mL.   For additional information, please refer to http://education.quest[facility].com/faq/QFT (This link is being provided for informational/educational purposes only.)   . Rhuematoid fact SerPl-aCnc 06/03/2016 <14  <14 IU/mL Final  . Cyclic Citrullin Peptide Ab 06/03/2016 <16  Units Final   Comment:   Reference Range Negative               < 20 Weak Positive            20 - 39 Moderate Positive        40 - 59 Strong Positive        > 59   . 14-3-3 eta Protein 06/03/2016 <0.2  <0.2 ng/mL Final   Comment:   This test was developed and its analytical performance characteristics have been determined by [facility]. It has not been cleared or approved by FDA. This assay has been validated pursuant to the CLIA regulations and is used for clinical purposes.   . WBC, UA 06/03/2016 NONE SEEN  <=5 WBC/HPF  Final  . RBC / HPF 06/03/2016 NONE SEEN  <=2 RBC/HPF Final  . Squamous Epithelial / LPF 06/03/2016 6-10* <=5 HPF Final  . Bacteria, UA 06/03/2016 NONE SEEN  NONE SEEN HPF Final  . Crystals 06/03/2016 See Below* NONE SEEN HPF Final  . Casts 06/03/2016 NONE SEEN  NONE SEEN LPF Final  . Yeast 06/03/2016 NONE SEEN  NONE SEEN HPF Final  . Immunofix Electr Int 06/03/2016 SEE NOTE   Final   Comment: IgG kappa monoclonal band present. Reviewed by Odis Hollingshead, MD, PhD, FCAP (Electronic Signature on File)      Imaging: Dg Chest 2 View  Result Date: 06/13/2016 CLINICAL DATA:  Screening prior to immunosuppressive therapy for rheumatoid arthritis. EXAM: CHEST  2 VIEW COMPARISON:  06/19/2003 FINDINGS: Lungs are adequately inflated without lobar consolidation or effusion. Cardiomediastinal silhouette is within normal. There is mild degenerative change of the spine. IMPRESSION: No active cardiopulmonary disease. Electronically Signed   By: Marin Olp M.D.   On: 06/13/2016 12:19    Speciality Comments: No specialty comments available.    Procedures:  No procedures performed Allergies: Meloxicam and Morphine and related   Assessment / Plan:     Visit Diagnoses: Rheumatoid arthritis of multiple sites with negative rheumatoid factor ([facility])  Abnormal SPEP  Elevated sed rate  Primary osteoarthritis of both knees     Orders: No orders of the defined types were placed in this encounter.  No orders of the defined types were placed in this encounter.   Face-to-face time spent with patient was *** minutes. 50% of time was spent in counseling and coordination of care.  Follow-Up Instructions: No Follow-up on file.   Bo Merino, MD  Note - This record has been created using Editor, commissioning.  Chart creation errors have been sought, but may not always  have been located. Such creation errors do not reflect on  the standard of medical care.

## 2016-07-11 DIAGNOSIS — M19041 Primary osteoarthritis, right hand: Secondary | ICD-10-CM | POA: Insufficient documentation

## 2016-07-11 DIAGNOSIS — M24522 Contracture, left elbow: Secondary | ICD-10-CM | POA: Insufficient documentation

## 2016-07-11 DIAGNOSIS — M19071 Primary osteoarthritis, right ankle and foot: Secondary | ICD-10-CM | POA: Insufficient documentation

## 2016-07-11 DIAGNOSIS — M19072 Primary osteoarthritis, left ankle and foot: Secondary | ICD-10-CM

## 2016-07-11 DIAGNOSIS — M160 Bilateral primary osteoarthritis of hip: Secondary | ICD-10-CM | POA: Insufficient documentation

## 2016-07-11 DIAGNOSIS — M47816 Spondylosis without myelopathy or radiculopathy, lumbar region: Secondary | ICD-10-CM | POA: Insufficient documentation

## 2016-07-11 DIAGNOSIS — M19042 Primary osteoarthritis, left hand: Secondary | ICD-10-CM

## 2016-07-14 ENCOUNTER — Ambulatory Visit: Payer: Self-pay | Admitting: Rheumatology

## 2016-07-26 ENCOUNTER — Telehealth: Payer: Self-pay | Admitting: Rheumatology

## 2016-07-26 NOTE — Progress Notes (Signed)
Office Visit Note  Patient: Kerry Harrison             Date of Birth: 08-19-1970           MRN: [id number]             PCP: Benito Mccreedy, MD Referring: Benito Mccreedy, MD Visit Date: 07/30/2016 Occupation: @GUAROCC @    Subjective:   Pain in multiple joints  History of Present Illness: Kerry Harrison is a 46 y.o. male with seronegative rheumatoid arthritis. He is been on methotrexate for almost 2 months now. And 8 tablets per week for 1-1/2 month. He has not noticed any improvement on methotrexate. He reports pain and discomfort in his hands, feet, knees, hips, shoulders, and elbows. He reports neck pain . He is been having swelling in his both wrists joints and right toe. He finished prednisone taper about 3 weeks ago.   Activities of Daily Living:  Patient reports morning stiffness All day.   Patient Reports nocturnal pain.  Difficulty dressing/grooming: Denies Difficulty climbing stairs: Reports Difficulty getting out of chair: Reports Difficulty using hands for taps, buttons, cutlery, and/or writing: Reports   Review of Systems  Constitutional: Positive for fatigue and weakness. Negative for weight gain and weight loss.  HENT: Negative for mouth sores, mouth dryness and nose dryness.   Eyes: Negative for pain, redness and dryness.  Respiratory: Negative for cough, shortness of breath and difficulty breathing.   Cardiovascular: Positive for swelling in legs/feet. Negative for chest pain, palpitations, hypertension and irregular heartbeat.  Gastrointestinal: Positive for diarrhea. Negative for blood in stool, constipation, nausea and vomiting.  Genitourinary: Negative for painful urination.  Musculoskeletal: Positive for arthralgias, joint pain, joint swelling, myalgias, morning stiffness and myalgias.  Skin: Negative for color change, rash, nodules/bumps and redness.  Neurological: Negative for dizziness and headaches.  Hematological: Positive for swollen glands  (cervical nodes).  Psychiatric/Behavioral: Positive for depressed mood and sleep disturbance. The patient is nervous/anxious.     PMFS History:  Patient Active Problem List   Diagnosis Date Noted  . Primary osteoarthritis of both hands 07/11/2016  . Bilateral primary osteoarthritis of hip 07/11/2016  . Primary osteoarthritis of both feet 07/11/2016  . Osteoarthritis of lumbar spine 07/11/2016  . Contracture of elbow joint, left 07/11/2016  . Primary osteoarthritis of both knees 06/03/2016  . Rheumatoid arthritis of multiple sites with negative rheumatoid factor ([place]) 06/03/2016  . Abnormal SPEP 06/03/2016  . Elevated sed rate 06/03/2016    Past Medical History:  Diagnosis Date  . Anorectal fistula   . Arthritis     Family History  Problem Relation Age of Onset  . Cancer Father     lung   Past Surgical History:  Procedure Laterality Date  . EVALUATION UNDER ANESTHESIA WITH ANAL FISTULECTOMY N/A 06/30/2012   Procedure: EXAM UNDER ANESTHESIA  fistulotomy, possible ;  Surgeon: Leighton Ruff, MD;  Location: [facility];  Service: General;  Laterality: N/A;  . INCISION AND DRAINAGE PERIRECTAL ABSCESS  09/16/2011   Procedure: IRRIGATION AND DEBRIDEMENT PERIRECTAL ABSCESS;  Surgeon: Madilyn Hook, DO;  Location: [facility];  Service: General;  Laterality: N/A;   Social History   Social History Narrative  . No narrative on file     Objective: Vital Signs: BP 140/80   Pulse 78   Resp 16   Wt 300 lb (136.1 kg)   BMI 39.04 kg/m    Physical Exam  Constitutional: He is oriented to person, place, and time. He  appears well-developed and well-nourished.  HENT:  Head: Normocephalic and atraumatic.  Eyes: Conjunctivae and EOM are normal. Pupils are equal, round, and reactive to light.  Neck: Normal range of motion. Neck supple.  Cardiovascular: Normal rate, regular rhythm and normal heart sounds.   Pulmonary/Chest: Effort normal and breath sounds normal.  Abdominal: Soft.  Bowel sounds are normal.  Neurological: He is alert and oriented to person, place, and time.  Skin: Skin is warm and dry. Capillary refill takes less than 2 seconds.  Psychiatric: He has a normal mood and affect. His behavior is normal.  Nursing note and vitals reviewed.    Musculoskeletal Exam: C-spine and thoracic lumbar spine good range of motion. Shoulder joints good range of motion. He has contracture in his bilateral elbow joints with no synovitis. Wrists joint MCPs PIPs DIPs are good range of motion with no synovitis. He had tenderness across PIP joints and wrists joints. Hip joints has limited range of motion with discomfort. Knee joints with no warmth swelling or effusion. Ankle joints are good range of motion with no swelling. He has right second PIP of his foot with tenderness and swelling.  CDAI Exam: CDAI Homunculus Exam:   Tenderness:  RUE: ulnohumeral and radiohumeral and wrist LUE: ulnohumeral and radiohumeral and wrist Right foot: 2nd MTP  Swelling:  Right foot: 2nd MTP  Joint Counts:  CDAI Tender Joint count: 4 CDAI Swollen Joint count: 0  Global Assessments:  Patient Global Assessment: 7 Provider Global Assessment: 7  CDAI Calculated Score: 18    Investigation: No additional findings.   Imaging: No results found.  Speciality Comments: No specialty comments available. 06/03/2016 CBC normal, CMP total protein slightly elevated at 8.4, SPEP restricted band migrating in the gamma globulin region, IFE IgG kappa monoclonal band present TB Gold negative, ESR 30, RF negative, anti-CCP negative, 14 33 eta negative  Procedures:  No procedures performed Allergies: Meloxicam and Morphine and related   Assessment / Plan:     Visit Diagnoses: Rheumatoid arthritis of multiple sites with negative rheumatoid factor (HCC) - RF negative, CCP negative, 14 33 eta negative, elevated ESR. He states he still having marked pain and discomfort in his joints but had no  synovitis on examination except for his right second toe swelling. He states he did better on prednisone and would like to have another prednisone taper until he can be a more aggressive therapy. I will give him prednisone taper 10 mg for one week, 7.5 mg for one week 5 mg for one week and 2.5 mg for one week. On reviewing my previous notes seen a definite improvement in synovitis and swollen joints.  High risk medication use - Methotrexate 8 tablets po q week, folic acid 66m po qd. We will check labs today.  Contracture of elbow joint, bilateral  Primary osteoarthritis of both hands: Some stiffness and discomfort  Bilateral primary osteoarthritis of hip - Bilateral moderate: Chronic pain  Primary osteoarthritis of both knees - Bilateral moderate with moderate chondromalacia patella: Chronic pain with no warmth or swelling  Primary osteoarthritis of both feet - Bilateral mild: He does have some right second toe PIP swelling.  DDD lumbar spine with spinal stenosis: Chronic pain  Abnormal SPEP - Monoclonal gammopathy, IFE IgG kappa Monoclonal band, patient is hematology appointments PM  Elevated sed rate    Orders: Orders Placed This Encounter  Procedures  . CBC with Differential/Platelet  . COMPLETE METABOLIC PANEL WITH GFR   No orders of the defined types  were placed in this encounter.   Face-to-face time spent with patient was 30 minutes. 50% of time was spent in counseling and coordination of care.  Follow-Up Instructions: Return in about 4 weeks (around 08/27/2016) for Rheumatoid arthritis.   Bo Merino, MD  Note - This record has been created using [employer].  Chart creation errors have been sought, but may not always  have been located. Such creation errors do not reflect on  the standard of medical care.

## 2016-07-26 NOTE — Telephone Encounter (Signed)
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 We requested hematology referral for evaluation. Please check on the status.

## 2016-07-27 NOTE — Telephone Encounter (Signed)
Patient has declined multiple appointments with Dr Marin Olp.   Dr Marin Olp, can you call Dr Estanislado Pandy concerning this patient? She has questions about treatment with Methotrexate.   Her Cell is [phone]

## 2016-07-29 ENCOUNTER — Other Ambulatory Visit: Payer: Self-pay | Admitting: Family

## 2016-07-29 DIAGNOSIS — R700 Elevated erythrocyte sedimentation rate: Secondary | ICD-10-CM

## 2016-07-29 DIAGNOSIS — R778 Other specified abnormalities of plasma proteins: Secondary | ICD-10-CM

## 2016-07-30 ENCOUNTER — Encounter: Payer: Self-pay | Admitting: Rheumatology

## 2016-07-30 ENCOUNTER — Ambulatory Visit (HOSPITAL_BASED_OUTPATIENT_CLINIC_OR_DEPARTMENT_OTHER): Payer: BLUE CROSS/BLUE SHIELD | Admitting: Family

## 2016-07-30 ENCOUNTER — Ambulatory Visit (INDEPENDENT_AMBULATORY_CARE_PROVIDER_SITE_OTHER): Payer: BLUE CROSS/BLUE SHIELD | Admitting: Rheumatology

## 2016-07-30 ENCOUNTER — Other Ambulatory Visit (HOSPITAL_BASED_OUTPATIENT_CLINIC_OR_DEPARTMENT_OTHER): Payer: BLUE CROSS/BLUE SHIELD

## 2016-07-30 ENCOUNTER — Ambulatory Visit: Payer: BLUE CROSS/BLUE SHIELD

## 2016-07-30 VITALS — BP 140/80 | HR 78 | Resp 16 | Wt 300.0 lb

## 2016-07-30 VITALS — BP 104/65 | HR 85 | Temp 98.3°F | Resp 20 | Wt 301.4 lb

## 2016-07-30 DIAGNOSIS — R778 Other specified abnormalities of plasma proteins: Secondary | ICD-10-CM

## 2016-07-30 DIAGNOSIS — M19042 Primary osteoarthritis, left hand: Secondary | ICD-10-CM | POA: Diagnosis not present

## 2016-07-30 DIAGNOSIS — M170 Bilateral primary osteoarthritis of knee: Secondary | ICD-10-CM | POA: Diagnosis not present

## 2016-07-30 DIAGNOSIS — R700 Elevated erythrocyte sedimentation rate: Secondary | ICD-10-CM

## 2016-07-30 DIAGNOSIS — M160 Bilateral primary osteoarthritis of hip: Secondary | ICD-10-CM | POA: Diagnosis not present

## 2016-07-30 DIAGNOSIS — M19071 Primary osteoarthritis, right ankle and foot: Secondary | ICD-10-CM | POA: Diagnosis not present

## 2016-07-30 DIAGNOSIS — M47816 Spondylosis without myelopathy or radiculopathy, lumbar region: Secondary | ICD-10-CM | POA: Diagnosis not present

## 2016-07-30 DIAGNOSIS — M0609 Rheumatoid arthritis without rheumatoid factor, multiple sites: Secondary | ICD-10-CM | POA: Diagnosis not present

## 2016-07-30 DIAGNOSIS — M19072 Primary osteoarthritis, left ankle and foot: Secondary | ICD-10-CM | POA: Diagnosis not present

## 2016-07-30 DIAGNOSIS — M19041 Primary osteoarthritis, right hand: Secondary | ICD-10-CM | POA: Diagnosis not present

## 2016-07-30 DIAGNOSIS — Z79899 Other long term (current) drug therapy: Secondary | ICD-10-CM

## 2016-07-30 DIAGNOSIS — M24522 Contracture, left elbow: Secondary | ICD-10-CM

## 2016-07-30 LAB — CBC WITH DIFFERENTIAL/PLATELET
Basophils Absolute: 0 cells/uL (ref 0–200)
Basophils Relative: 0 %
Eosinophils Absolute: 166 cells/uL (ref 15–500)
Eosinophils Relative: 2 %
HCT: 43.9 % (ref 38.5–50.0)
HEMOGLOBIN: 14 g/dL (ref 13.2–17.1)
Lymphocytes Relative: 25 %
Lymphs Abs: 2075 cells/uL (ref 850–3900)
MCH: 26.3 pg — ABNORMAL LOW (ref 27.0–33.0)
MCHC: 31.9 g/dL — AB (ref 32.0–36.0)
MCV: 82.4 fL (ref 80.0–100.0)
MPV: 9.1 fL (ref 7.5–12.5)
Monocytes Absolute: 664 cells/uL (ref 200–950)
Monocytes Relative: 8 %
NEUTROS PCT: 65 %
Neutro Abs: 5395 cells/uL (ref 1500–7800)
Platelets: 401 10*3/uL — ABNORMAL HIGH (ref 140–400)
RBC: 5.33 MIL/uL (ref 4.20–5.80)
RDW: 18.1 % — AB (ref 11.0–15.0)
WBC: 8.3 10*3/uL (ref 3.8–10.8)

## 2016-07-30 LAB — COMPLETE METABOLIC PANEL WITH GFR
ALBUMIN: 3.8 g/dL (ref 3.6–5.1)
ALK PHOS: 79 U/L (ref 40–115)
ALT: 32 U/L (ref 9–46)
AST: 27 U/L (ref 10–40)
BUN: 9 mg/dL (ref 7–25)
CALCIUM: 9.6 mg/dL (ref 8.6–10.3)
CHLORIDE: 104 mmol/L (ref 98–110)
CO2: 25 mmol/L (ref 20–31)
Creat: 0.98 mg/dL (ref 0.60–1.35)
Glucose, Bld: 100 mg/dL — ABNORMAL HIGH (ref 65–99)
POTASSIUM: 3.8 mmol/L (ref 3.5–5.3)
Sodium: 140 mmol/L (ref 135–146)
Total Bilirubin: 0.4 mg/dL (ref 0.2–1.2)
Total Protein: 7.9 g/dL (ref 6.1–8.1)

## 2016-07-30 LAB — CBC WITH DIFFERENTIAL (CANCER CENTER ONLY)
BASO#: 0 10*3/uL (ref 0.0–0.2)
BASO%: 0.4 % (ref 0.0–2.0)
EOS ABS: 0.2 10*3/uL (ref 0.0–0.5)
EOS%: 2.3 % (ref 0.0–7.0)
HEMATOCRIT: 42.3 % (ref 38.7–49.9)
HGB: 14.2 g/dL (ref 13.0–17.1)
LYMPH#: 2.2 10*3/uL (ref 0.9–3.3)
LYMPH%: 25.8 % (ref 14.0–48.0)
MCH: 27.4 pg — AB (ref 28.0–33.4)
MCHC: 33.6 g/dL (ref 32.0–35.9)
MCV: 82 fL (ref 82–98)
MONO#: 0.8 10*3/uL (ref 0.1–0.9)
MONO%: 9.4 % (ref 0.0–13.0)
NEUT#: 5.2 10*3/uL (ref 1.5–6.5)
NEUT%: 62.1 % (ref 40.0–80.0)
PLATELETS: 372 10*3/uL (ref 145–400)
RBC: 5.19 10*6/uL (ref 4.20–5.70)
RDW: 18.2 % — AB (ref 11.1–15.7)
WBC: 8.4 10*3/uL (ref 4.0–10.0)

## 2016-07-30 LAB — CHCC SATELLITE - SMEAR

## 2016-07-30 MED ORDER — PREDNISONE 5 MG PO TABS: ORAL_TABLET | ORAL | 0 refills | Status: DC

## 2016-07-30 NOTE — Progress Notes (Signed)
Hematology/Oncology Consultation   Name: Kerry Harrison      MRN: [id number]    Location: Room/bed info not found  Date: 07/30/2016 Time:2:56 PM   REFERRING PHYSICIAN: Shaili Deveshwar,   REASON FOR CONSULT: Abnormal SPEP    DIAGNOSIS:  Rheumatoid and osteoarthritis  MGUS  HISTORY OF PRESENT ILLNESS: Mr. Kerry Harrison is a very pleasant 46 yo African American male with trace M-spike and history of MGUS. He had a bone marrow biopsy in 2005 that was unremarkable. We have rechecked his SPEP and results are pending.  Hgb is stable at 14.2 with an MCV of 82. His WBC count is 8.4 with a platelet count of 372.  His complaint at this time is joint pain and swelling "all over, head to toe." He has a history of rheumatoid and osteoarthritis. He states that he has tried treatment with both Methotrexate and Enbrel but stopped these due to not liking shots and he felt that he was not improving. 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 He is required to stand for long periods of time which he states is painful and becoming unbearable. He states that he wants to work and is hoping rheumatology can do something to help with his pain. He is currently on a Prednisone taper. He verbalized that he is taking his folic acid daily as prescribed.  He has no personal history of cancer. Familial history includes father with metastatic lung cancer (he was a smoker) and paternal grandfather with esophageal cancer.  No episodes of bleeding, bruising or petechiae. No lymphadenopathy found on exam.  No fever, chills, n/v, cough, rash, dizziness, SOB, chest pain, palpitations, abdominal pain or changes in bowel or bladder habits.  No numbness or tingling in his extremities.  He states that his appetite comes and goes but he is staying well hydrated. His weight is stable.   ROS: All other 10 point review of systems is negative.   PAST MEDICAL HISTORY:   Past Medical History:  Diagnosis Date  . Anorectal fistula   . Arthritis      ALLERGIES: Allergies  Allergen Reactions  . Meloxicam Diarrhea and Other (See Comments)    Dizziness  . Morphine And Related Other (See Comments)    LAST TIME GIVEN CAUSED HYPOTENSION      MEDICATIONS:  Current Outpatient Prescriptions on File Prior to Visit  Medication Sig Dispense Refill  . colchicine 0.6 MG tablet Take 0.6 mg by mouth 2 (two) times daily.    . folic acid (FOLVITE) 1 MG tablet Take 2 tablets (2 mg total) by mouth daily. 180 tablet 4  . methotrexate (RHEUMATREX) 2.5 MG tablet Take 6 tabs po per week x 2 week, then 8 tabs po weekly. Caution:Chemotherapy. Protect from light. 32 tablet 2  . predniSONE (DELTASONE) 5 MG tablet 10 mg q AM x 1 week, then 7.5 mg q Am x 1 week, then 5 mg q AM x 1 week, then 2.5 mg q Am x 1 week 35 tablet 0   No current facility-administered medications on file prior to visit.      PAST SURGICAL HISTORY Past Surgical History:  Procedure Laterality Date  . EVALUATION UNDER ANESTHESIA WITH ANAL FISTULECTOMY N/A 06/30/2012   Procedure: EXAM UNDER ANESTHESIA  fistulotomy, possible ;  Surgeon: Leighton Ruff, MD;  Location: [facility];  Service: General;  Laterality: N/A;  . INCISION AND DRAINAGE PERIRECTAL ABSCESS  09/16/2011   Procedure: IRRIGATION AND DEBRIDEMENT PERIRECTAL ABSCESS;  Surgeon:  Madilyn Hook, DO;  Location: [facility];  Service: General;  Laterality: N/A;    FAMILY HISTORY: Family History  Problem Relation Age of Onset  . Cancer Father     lung    SOCIAL HISTORY:  reports that he quit smoking about 5 years ago. His smoking use included Cigarettes. He quit after 6.00 years of use. He has never used smokeless tobacco. He reports that he drinks about 1.2 oz of alcohol per week . He reports that he does not use drugs.  PERFORMANCE STATUS: The patient's performance status is 0 - Asymptomatic  PHYSICAL EXAM: Most Recent Vital Signs: There were no vitals taken for this visit. There were no vitals taken for this  visit.  General Appearance:    Alert, cooperative, no distress, appears stated age  Head:    Normocephalic, without obvious abnormality, atraumatic  Eyes:    PERRL, conjunctiva/corneas clear, EOM's intact, fundi    benign, both eyes             Throat:   Lips, mucosa, and tongue normal; teeth and gums normal  Neck:   Supple, symmetrical, trachea midline, no adenopathy;       thyroid:  No enlargement/tenderness/nodules; no carotid   bruit or JVD  Back:     Symmetric, no curvature, ROM normal, no CVA tenderness  Lungs:     Clear to auscultation bilaterally, respirations unlabored  Chest wall:    No tenderness or deformity  Heart:    Regular rate and rhythm, S1 and S2 normal, no murmur, rub   or gallop  Abdomen:     Soft, non-tender, bowel sounds active all four quadrants,    no masses, no organomegaly        Extremities:   Extremities normal, atraumatic, no cyanosis or edema  Pulses:   2+ and symmetric all extremities  Skin:   Skin color, texture, turgor normal, no rashes or lesions  Lymph nodes:   Cervical, supraclavicular, and axillary nodes normal  Neurologic:   CNII-XII intact. Normal strength, sensation and reflexes      throughout    LABORATORY DATA:  Results for orders placed or performed in visit on 07/30/16 (from the past 48 hour(s))  CBC w/Diff     Status: Abnormal   Collection Time: 07/30/16  2:19 PM  Result Value Ref Range   WBC 8.4 4.0 - 10.0 10e3/uL   RBC 5.19 4.20 - 5.70 10e6/uL   HGB 14.2 13.0 - 17.1 g/dL   HCT 42.3 38.7 - 49.9 %   MCV 82 82 - 98 fL   MCH 27.4 (L) 28.0 - 33.4 pg   MCHC 33.6 32.0 - 35.9 g/dL   RDW 18.2 (H) 11.1 - 15.7 %   Platelets 372 145 - 400 10e3/uL   NEUT# 5.2 1.5 - 6.5 10e3/uL   LYMPH# 2.2 0.9 - 3.3 10e3/uL   MONO# 0.8 0.1 - 0.9 10e3/uL   Eosinophils Absolute 0.2 0.0 - 0.5 10e3/uL   BASO# 0.0 0.0 - 0.2 10e3/uL   NEUT% 62.1 40.0 - 80.0 %   LYMPH% 25.8 14.0 - 48.0 %   MONO% 9.4 0.0 - 13.0 %   EOS% 2.3 0.0 - 7.0 %   BASO% 0.4 0.0 -  2.0 %  Smear     Status: None   Collection Time: 07/30/16  2:19 PM  Result Value Ref Range   Smear Result Smear Available       RADIOGRAPHY: No results found.     PATHOLOGY:  None  ASSESSMENT/PLAN: Mr. Kerry Harrison is a very pleasant 46 yo [place] American male with trace M-spike and history of MGUS that appears to be reactive due to severe arthritis. He has both rheumatoid and osteoarthritis is currently followed by Rheumatology. He is having a great deal of joint pain and swelling which is effecting his job performance and he is afraid that he is going to be let go.  He plans to follow-up again with rheumatology to further discuss treatment options.  On his most recent SPEP, there was no M-spike. No anemia at this time.   We will plan to see him back again in 6 months for repeat lab work and hopefully by that time his arthritis will be better controlled.   All questions were answered. Both he and his wife know to contact our office with any hematologic questions or concerns. We can certainly see him much sooner if necessary.  He was discussed with and also seen by Dr. Marin Olp and he is in agreement with the aforementioned.   [facility]      Addendum:  I saw and examined the patient with Delvonte Berenson. I agree with the above assessment.  I think it is very important that he had a bone marrow test 10 years ago he says. Apparently, this was also for an M spike.  I have to believe that the M spike is inflammatory and not anything related to a plasma cell dyscrasia. He has rheumatoid arthritis and osteoarthritis.  I suppose that he is at a high risk for developing a plasma cell disorder.  He definitely has his frustrations with the amount of pain and inflammation that he has. It is hard for him to work. I'm sure that rheumatology will be able to help with this.  We will see what his monoclonal studies show.  I find it informative that we had the SPEP done 2 months ago, there is not even  enough monoclonal protein to measure.  We spent about 40 minutes with him and his wife. He is fine to talk with.  We will probably see him back in 6 months. I think this would be the standard follow-up for MGUS surveillance.  Lattie Haw, MD

## 2016-07-30 NOTE — Telephone Encounter (Signed)
Patient has advised in the office today he will see Dr Marin Olp today

## 2016-07-31 LAB — COMPREHENSIVE METABOLIC PANEL (CC13)
A/G RATIO: 1 — AB (ref 1.2–2.2)
ALT: 32 IU/L (ref 0–44)
AST (SGOT): 24 IU/L (ref 0–40)
Albumin, Serum: 3.8 g/dL (ref 3.5–5.5)
Alkaline Phosphatase, S: 94 IU/L (ref 39–117)
BUN/Creatinine Ratio: 11 (ref 9–20)
BUN: 10 mg/dL (ref 6–24)
Bilirubin Total: 0.3 mg/dL (ref 0.0–1.2)
CALCIUM: 9.4 mg/dL (ref 8.7–10.2)
Carbon Dioxide, Total: 27 mmol/L (ref 18–29)
Chloride, Ser: 103 mmol/L (ref 96–106)
Creatinine, Ser: 0.95 mg/dL (ref 0.76–1.27)
GFR, EST AFRICAN AMERICAN: 109 mL/min/{1.73_m2} (ref >59)
GFR, EST NON AFRICAN AMERICAN: 94 mL/min/{1.73_m2} (ref >59)
GLOBULIN, TOTAL: 3.7 g/dL (ref 1.5–4.5)
Glucose: 107 mg/dL — ABNORMAL HIGH (ref 65–99)
POTASSIUM: 3.8 mmol/L (ref 3.5–5.2)
SODIUM: 136 mmol/L (ref 134–144)
TOTAL PROTEIN: 7.5 g/dL (ref 6.0–8.5)

## 2016-07-31 LAB — SEDIMENTATION RATE: Sedimentation Rate-Westergren: 34 mm/hr — ABNORMAL HIGH (ref 0–15)

## 2016-07-31 LAB — LACTATE DEHYDROGENASE: LDH: 124 U/L — ABNORMAL LOW (ref 125–245)

## 2016-08-03 ENCOUNTER — Telehealth: Payer: Self-pay | Admitting: Radiology

## 2016-08-03 LAB — PROTEIN ELECTROPHORESIS, SERUM
A/G RATIO SPE: 0.8 (ref 0.7–1.7)
ALBUMIN: 3.3 g/dL (ref 2.9–4.4)
ALPHA 2: 0.9 g/dL (ref 0.4–1.0)
Alpha 1: 0.3 g/dL (ref 0.0–0.4)
BETA: 1.3 g/dL (ref 0.7–1.3)
GAMMA GLOBULIN: 1.8 g/dL (ref 0.4–1.8)
Globulin, Total: 4.3 g/dL — ABNORMAL HIGH (ref 2.2–3.9)
M-Spike, %: 1 g/dL — ABNORMAL HIGH
Total Protein: 7.6 g/dL (ref 6.0–8.5)

## 2016-08-03 NOTE — Telephone Encounter (Signed)
I have called patient to advise labs are normal  

## 2016-08-03 NOTE — Telephone Encounter (Signed)
-----   Message from Candice Camp, RT sent at 07/31/2016 11:20 AM EDT ----- Normal labs

## 2016-08-06 NOTE — Telephone Encounter (Signed)
error 

## 2016-09-04 NOTE — Progress Notes (Deleted)
Office Visit Note  Patient: Kerry Harrison             Date of Birth: 18-May-1970           MRN: [id number]             PCP: Benito Mccreedy, MD Referring: Benito Mccreedy, MD Visit Date: 09/10/2016 Occupation: @GUAROCC @    Subjective:  No chief complaint on file.   History of Present Illness: Kerry Harrison is a 46 y.o. male ***   Activities of Daily Living:  Patient reports morning stiffness for *** {minute/hour:19697}.   Patient {ACTIONS;DENIES/REPORTS:21021675::"Denies"} nocturnal pain.  Difficulty dressing/grooming: {ACTIONS;DENIES/REPORTS:21021675::"Denies"} Difficulty climbing stairs: {ACTIONS;DENIES/REPORTS:21021675::"Denies"} Difficulty getting out of chair: {ACTIONS;DENIES/REPORTS:21021675::"Denies"} Difficulty using hands for taps, buttons, cutlery, and/or writing: {ACTIONS;DENIES/REPORTS:21021675::"Denies"}   No Rheumatology ROS completed.   PMFS History:  Patient Active Problem List   Diagnosis Date Noted  . Primary osteoarthritis of both hands 07/11/2016  . Bilateral primary osteoarthritis of hip 07/11/2016  . Primary osteoarthritis of both feet 07/11/2016  . Osteoarthritis of lumbar spine 07/11/2016  . Contracture of elbow joint, left 07/11/2016  . Primary osteoarthritis of both knees 06/03/2016  . Rheumatoid arthritis of multiple sites with negative rheumatoid factor ([place]) 06/03/2016  . Abnormal SPEP 06/03/2016  . Elevated sed rate 06/03/2016    Past Medical History:  Diagnosis Date  . Anorectal fistula   . Arthritis     Family History  Problem Relation Age of Onset  . Cancer Father        lung   Past Surgical History:  Procedure Laterality Date  . EVALUATION UNDER ANESTHESIA WITH ANAL FISTULECTOMY N/A 06/30/2012   Procedure: EXAM UNDER ANESTHESIA  fistulotomy, possible ;  Surgeon: Leighton Ruff, MD;  Location: [facility];  Service: General;  Laterality: N/A;  . INCISION AND DRAINAGE PERIRECTAL ABSCESS  09/16/2011   Procedure:  IRRIGATION AND DEBRIDEMENT PERIRECTAL ABSCESS;  Surgeon: Madilyn Hook, DO;  Location: [facility];  Service: General;  Laterality: N/A;   Social History   Social History Narrative  . No narrative on file     Objective: Vital Signs: There were no vitals taken for this visit.   Physical Exam   Musculoskeletal Exam: ***  CDAI Exam: No CDAI exam completed.    Investigation: No additional findings.   CBC Latest Ref Rng & Units 07/30/2016 07/30/2016 06/03/2016  WBC 4.0 - 10.0 10e3/uL 8.4 8.3 8.5  Hemoglobin 13.0 - 17.1 g/dL 14.2 14.0 14.9  Hematocrit 38.7 - 49.9 % 42.3 43.9 44.8  Platelets 145 - 400 10e3/uL 372 401(H) 378    CMP Latest Ref Rng & Units 07/30/2016 07/30/2016 07/30/2016  Glucose 65 - 99 mg/dL - 107(H) 100(H)  BUN 6 - 24 mg/dL - 10 9  Creatinine 0.76 - 1.27 mg/dL - 0.95 0.98  Sodium 134 - 144 mmol/L - 136 140  Potassium 3.5 - 5.2 mmol/L - 3.8 3.8  Chloride 96 - 106 mmol/L - 103 104  CO2 18 - 29 mmol/L - 27 25  Calcium 8.7 - 10.2 mg/dL - 9.4 9.6  Total Protein 6.0 - 8.5 g/dL 7.6 7.5 7.9  Total Bilirubin 0.0 - 1.2 mg/dL - 0.3 0.4  Alkaline Phos 39 - 117 IU/L - 94 79  AST 0 - 40 IU/L - 24 27  ALT 0 - 44 IU/L - 32 32      Imaging: No results found.  Speciality Comments: No specialty comments available.    Procedures:  No procedures performed Allergies: Meloxicam  and Morphine and related   Assessment / Plan:     Visit Diagnoses: Rheumatoid arthritis of multiple sites with negative rheumatoid factor (HCC)  Abnormal SPEP  Elevated sed rate  Primary osteoarthritis of both knees  Primary osteoarthritis of both hands  Bilateral primary osteoarthritis of hip  Primary osteoarthritis of both feet  Osteoarthritis of lumbar spine, unspecified spinal osteoarthritis complication status  Contracture of elbow joint, left    Orders: No orders of the defined types were placed in this encounter.  No orders of the defined types were placed in this  encounter.   Face-to-face time spent with patient was *** minutes. 50% of time was spent in counseling and coordination of care.  Follow-Up Instructions: No Follow-up on file.   Marlisha Vanwyk, RT  Note - This record has been created using [employer].  Chart creation errors have been sought, but may not always  have been located. Such creation errors do not reflect on  the standard of medical care.

## 2016-09-10 ENCOUNTER — Ambulatory Visit: Payer: BLUE CROSS/BLUE SHIELD | Admitting: Rheumatology

## 2016-09-21 NOTE — Progress Notes (Deleted)
Office Visit Note  Patient: Kerry Harrison             Date of Birth: 13-Mar-1971           MRN: [id number]             PCP: Benito Mccreedy, MD Referring: Benito Mccreedy, MD Visit Date: 09/23/2016 Occupation: @GUAROCC @    Subjective:  No chief complaint on file.   History of Present Illness: Kerry Harrison is a 46 y.o. male ***   Activities of Daily Living:  Patient reports morning stiffness for *** {minute/hour:19697}.   Patient {ACTIONS;DENIES/REPORTS:21021675::"Denies"} nocturnal pain.  Difficulty dressing/grooming: {ACTIONS;DENIES/REPORTS:21021675::"Denies"} Difficulty climbing stairs: {ACTIONS;DENIES/REPORTS:21021675::"Denies"} Difficulty getting out of chair: {ACTIONS;DENIES/REPORTS:21021675::"Denies"} Difficulty using hands for taps, buttons, cutlery, and/or writing: {ACTIONS;DENIES/REPORTS:21021675::"Denies"}   No Rheumatology ROS completed.   PMFS History:  Patient Active Problem List   Diagnosis Date Noted  . Primary osteoarthritis of both hands 07/11/2016  . Bilateral primary osteoarthritis of hip 07/11/2016  . Primary osteoarthritis of both feet 07/11/2016  . Osteoarthritis of lumbar spine 07/11/2016  . Contracture of elbow joint, left 07/11/2016  . Primary osteoarthritis of both knees 06/03/2016  . Rheumatoid arthritis of multiple sites with negative rheumatoid factor ([place]) 06/03/2016  . Abnormal SPEP 06/03/2016  . Elevated sed rate 06/03/2016    Past Medical History:  Diagnosis Date  . Anorectal fistula   . Arthritis     Family History  Problem Relation Age of Onset  . Cancer Father        lung   Past Surgical History:  Procedure Laterality Date  . EVALUATION UNDER ANESTHESIA WITH ANAL FISTULECTOMY N/A 06/30/2012   Procedure: EXAM UNDER ANESTHESIA  fistulotomy, possible ;  Surgeon: Leighton Ruff, MD;  Location: [facility] [facility];  Service: General;  Laterality: N/A;  . INCISION AND DRAINAGE PERIRECTAL ABSCESS  09/16/2011   Procedure:  IRRIGATION AND DEBRIDEMENT PERIRECTAL ABSCESS;  Surgeon: Madilyn Hook, DO;  Location: [facility];  Service: General;  Laterality: N/A;   Social History   Social History Narrative  . No narrative on file     Objective: Vital Signs: There were no vitals taken for this visit.   Physical Exam   Musculoskeletal Exam: ***  CDAI Exam: No CDAI exam completed.    Investigation: Findings:  06/03/2016 CBC normal, CMP total protein slightly elevated at 8.4, SPEP restricted band migrating in the gamma globulin region, IFE IgG kappa monoclonal band present TB Gold negative, ESR 30, RF negative, anti-CCP negative, 14 33 eta negative  CBC Latest Ref Rng & Units 07/30/2016 07/30/2016 06/03/2016  WBC 4.0 - 10.0 10e3/uL 8.4 8.3 8.5  Hemoglobin 13.0 - 17.1 g/dL 14.2 14.0 14.9  Hematocrit 38.7 - 49.9 % 42.3 43.9 44.8  Platelets 145 - 400 10e3/uL 372 401(H) 378   CMP Latest Ref Rng & Units 07/30/2016 07/30/2016 07/30/2016  Glucose 65 - 99 mg/dL - 107(H) 100(H)  BUN 6 - 24 mg/dL - 10 9  Creatinine 0.76 - 1.27 mg/dL - 0.95 0.98  Sodium 134 - 144 mmol/L - 136 140  Potassium 3.5 - 5.2 mmol/L - 3.8 3.8  Chloride 96 - 106 mmol/L - 103 104  CO2 18 - 29 mmol/L - 27 25  Calcium 8.7 - 10.2 mg/dL - 9.4 9.6  Total Protein 6.0 - 8.5 g/dL 7.6 7.5 7.9  Total Bilirubin 0.0 - 1.2 mg/dL - 0.3 0.4  Alkaline Phos 39 - 117 IU/L - 94 79  AST 0 - 40 IU/L - 24 27  ALT 0 - 44 IU/L - 32 32    Imaging: No results found.  Speciality Comments: No specialty comments available.    Procedures:  No procedures performed Allergies: Meloxicam and Morphine and related   Assessment / Plan:     Visit Diagnoses: Rheumatoid arthritis of multiple sites with negative rheumatoid factor (HCC)  Primary osteoarthritis of both hands  Contracture of elbow joint, left  Bilateral primary osteoarthritis of hip  Primary osteoarthritis of both knees  Primary osteoarthritis of both feet  Spondylosis of lumbar region without myelopathy or  radiculopathy  Elevated sed rate  Abnormal SPEP    Orders: No orders of the defined types were placed in this encounter.  No orders of the defined types were placed in this encounter.   Face-to-face time spent with patient was *** minutes. 50% of time was spent in counseling and coordination of care.  Follow-Up Instructions: No Follow-up on file.   Zhi Geier, RT  Note - This record has been created using [employer].  Chart creation errors have been sought, but may not always  have been located. Such creation errors do not reflect on  the standard of medical care.

## 2016-09-23 ENCOUNTER — Ambulatory Visit: Payer: BLUE CROSS/BLUE SHIELD | Admitting: Rheumatology

## 2016-09-24 ENCOUNTER — Telehealth: Payer: Self-pay | Admitting: Radiology

## 2016-09-24 NOTE — Telephone Encounter (Signed)
I made an FYI so we would see it on his chart.

## 2016-09-24 NOTE — Telephone Encounter (Signed)
Letter sent to patient/ will you please make note so this is visible in scheduling tab.

## 2016-09-24 NOTE — Telephone Encounter (Signed)
-----   Message from Bo Merino, MD sent at 09/23/2016  4:52 PM EDT ----- Please send patient to discharge note. He has missed 2 appointments this month. He is also not had any lab work on methotrexate. Thanks SD ----- Message ----- From: Candice Camp, RT Sent: 09/23/2016   1:54 PM To: Bo Merino, MD  Patient has No showed today, also on 6.15.18  Please advise

## 2016-09-28 ENCOUNTER — Telehealth (INDEPENDENT_AMBULATORY_CARE_PROVIDER_SITE_OTHER): Payer: Self-pay | Admitting: Rheumatology

## 2016-09-28 NOTE — Telephone Encounter (Signed)
DR. Arlean Hopping Discharge Letter mailed to patient "certified" today

## 2016-11-06 ENCOUNTER — Telehealth (INDEPENDENT_AMBULATORY_CARE_PROVIDER_SITE_OTHER): Payer: Self-pay | Admitting: Rheumatology

## 2016-11-06 NOTE — Telephone Encounter (Signed)
certified mail (discharge letter) returned unsigned for

## 2017-05-25 ENCOUNTER — Encounter (HOSPITAL_BASED_OUTPATIENT_CLINIC_OR_DEPARTMENT_OTHER): Payer: Self-pay | Admitting: *Deleted

## 2017-05-25 ENCOUNTER — Other Ambulatory Visit: Payer: Self-pay

## 2017-05-25 ENCOUNTER — Emergency Department (HOSPITAL_BASED_OUTPATIENT_CLINIC_OR_DEPARTMENT_OTHER)
Admission: EM | Admit: 2017-05-25 | Discharge: 2017-05-25 | Disposition: A | Discharge status: Home / Self Care | Payer: BLUE CROSS/BLUE SHIELD | Attending: Emergency Medicine | Admitting: Emergency Medicine

## 2017-05-25 DIAGNOSIS — Z87891 Personal history of nicotine dependence: Secondary | ICD-10-CM | POA: Diagnosis not present

## 2017-05-25 DIAGNOSIS — K5792 Diverticulitis of intestine, part unspecified, without perforation or abscess without bleeding: Secondary | ICD-10-CM | POA: Diagnosis not present

## 2017-05-25 DIAGNOSIS — R109 Unspecified abdominal pain: Secondary | ICD-10-CM | POA: Diagnosis present

## 2017-05-25 DIAGNOSIS — Z79899 Other long term (current) drug therapy: Secondary | ICD-10-CM | POA: Insufficient documentation

## 2017-05-25 HISTORY — DX: Spondylosis without myelopathy or radiculopathy, lumbar region: M47.816

## 2017-05-25 HISTORY — DX: Rheumatoid arthritis, unspecified: M06.9

## 2017-05-25 HISTORY — DX: Diverticulosis of intestine, part unspecified, without perforation or abscess without bleeding: K57.90

## 2017-05-25 HISTORY — DX: Bilateral primary osteoarthritis of hip: M16.0

## 2017-05-25 LAB — URINALYSIS, MICROSCOPIC (REFLEX): WBC, UA: NONE SEEN WBC/hpf (ref 0–5)

## 2017-05-25 LAB — COMPREHENSIVE METABOLIC PANEL
ALBUMIN: 2.7 g/dL — AB (ref 3.5–5.0)
ALK PHOS: 80 U/L (ref 38–126)
ALT: 18 U/L (ref 17–63)
AST: 27 U/L (ref 15–41)
Anion gap: 10 (ref 5–15)
BUN: 13 mg/dL (ref 6–20)
CO2: 26 mmol/L (ref 22–32)
CREATININE: 0.76 mg/dL (ref 0.61–1.24)
Calcium: 8.8 mg/dL — ABNORMAL LOW (ref 8.9–10.3)
Chloride: 99 mmol/L — ABNORMAL LOW (ref 101–111)
GFR calc non Af Amer: >60 mL/min (ref >60)
GLUCOSE: 110 mg/dL — AB (ref 65–99)
Potassium: 3.1 mmol/L — ABNORMAL LOW (ref 3.5–5.1)
SODIUM: 135 mmol/L (ref 135–145)
Total Bilirubin: 0.5 mg/dL (ref 0.3–1.2)
Total Protein: 9.4 g/dL — ABNORMAL HIGH (ref 6.5–8.1)

## 2017-05-25 LAB — I-STAT CG4 LACTIC ACID, ED
LACTIC ACID, VENOUS: 2.31 mmol/L — AB (ref 0.5–1.9)
Lactic Acid, Venous: 1.35 mmol/L (ref 0.5–1.9)

## 2017-05-25 LAB — CBC WITH DIFFERENTIAL/PLATELET
BASOS PCT: 0 %
Basophils Absolute: 0 10*3/uL (ref 0.0–0.1)
EOS ABS: 0.2 10*3/uL (ref 0.0–0.7)
EOS PCT: 2 %
HCT: 38.1 % — ABNORMAL LOW (ref 39.0–52.0)
Hemoglobin: 12.5 g/dL — ABNORMAL LOW (ref 13.0–17.0)
Lymphocytes Relative: 19 %
Lymphs Abs: 1.7 10*3/uL (ref 0.7–4.0)
MCH: 24.6 pg — ABNORMAL LOW (ref 26.0–34.0)
MCHC: 32.8 g/dL (ref 30.0–36.0)
MCV: 74.9 fL — ABNORMAL LOW (ref 78.0–100.0)
MONO ABS: 0.5 10*3/uL (ref 0.1–1.0)
MONOS PCT: 6 %
Neutro Abs: 6.3 10*3/uL (ref 1.7–7.7)
Neutrophils Relative %: 73 %
Platelets: 499 10*3/uL — ABNORMAL HIGH (ref 150–400)
RBC: 5.09 MIL/uL (ref 4.22–5.81)
RDW: 17.1 % — AB (ref 11.5–15.5)
WBC: 8.7 10*3/uL (ref 4.0–10.5)

## 2017-05-25 LAB — URINALYSIS, ROUTINE W REFLEX MICROSCOPIC
Bilirubin Urine: NEGATIVE
GLUCOSE, UA: NEGATIVE mg/dL
Ketones, ur: NEGATIVE mg/dL
LEUKOCYTES UA: NEGATIVE
Nitrite: NEGATIVE
Protein, ur: NEGATIVE mg/dL
Specific Gravity, Urine: 1.025 (ref 1.005–1.030)
pH: 6 (ref 5.0–8.0)

## 2017-05-25 MED ORDER — CIPROFLOXACIN HCL 500 MG PO TABS
500.0000 mg | ORAL_TABLET | Freq: Once | ORAL | Status: AC
  Administered 2017-05-25: 500 mg via ORAL
  Filled 2017-05-25: qty 1

## 2017-05-25 MED ORDER — METRONIDAZOLE 500 MG PO TABS
500.0000 mg | ORAL_TABLET | Freq: Once | ORAL | Status: AC
  Administered 2017-05-25: 500 mg via ORAL
  Filled 2017-05-25: qty 1

## 2017-05-25 MED ORDER — CIPROFLOXACIN HCL 500 MG PO TABS: 500.0000 mg | ORAL_TABLET | Freq: Two times a day (BID) | ORAL | 0 refills | Status: DC

## 2017-05-25 MED ORDER — ONDANSETRON HCL 4 MG/2ML IJ SOLN
4.0000 mg | Freq: Once | INTRAMUSCULAR | Status: AC
  Administered 2017-05-25: 4 mg via INTRAVENOUS
  Filled 2017-05-25: qty 2

## 2017-05-25 MED ORDER — ONDANSETRON 4 MG PO TBDP: 4.0000 mg | ORAL_TABLET | Freq: Three times a day (TID) | ORAL | 0 refills | Status: DC | PRN

## 2017-05-25 MED ORDER — SODIUM CHLORIDE 0.9 % IV BOLUS (SEPSIS)
1000.0000 mL | Freq: Once | INTRAVENOUS | Status: AC
  Administered 2017-05-25: 1000 mL via INTRAVENOUS

## 2017-05-25 MED ORDER — METRONIDAZOLE 500 MG PO TABS: 500.0000 mg | ORAL_TABLET | Freq: Two times a day (BID) | ORAL | 0 refills | Status: DC

## 2017-05-25 NOTE — ED Notes (Signed)
Lactic acid results given to Dr Rex Kras and RN.

## 2017-05-25 NOTE — ED Provider Notes (Signed)
[facility]PART[facility] Provider Note   CSN: [id number] Arrival date & time: 05/25/17  1518     History   Chief Complaint Chief Complaint  Patient presents with  . Abdominal Pain    HPI Kerry Harrison is a 47 y.o. male.  47 year old male with past medical history including diverticulitis, anorectal fistula, rheumatoid arthritis who presents with abdominal pain and nausea.  5 days ago he began having left-sided abdominal pain that has been intermittent and associated with nausea and decreased appetite as well as nonbloody diarrhea.  He denies any vomiting.  He has felt feverish but has not measured his temperature.  He had similar symptoms in the past when he was diagnosed with diverticulitis.  He states this feels the same.  He denies any radiation of pain to his back or urinary symptoms but he has noticed his urine is dark.  He has had some nasal congestion but no cough. Pain does not change with eating.   The history is provided by the patient.  Abdominal Pain      Past Medical History:  Diagnosis Date  . Anorectal fistula   . Arthritis   . Diverticulosis   . Osteoarthritis of hips, bilateral   . Osteoarthritis of lumbar spine   . Rheumatoid arthritis [facility])     Patient Active Problem List   Diagnosis Date Noted  . Primary osteoarthritis of both hands 07/11/2016  . Bilateral primary osteoarthritis of hip 07/11/2016  . Primary osteoarthritis of both feet 07/11/2016  . Osteoarthritis of lumbar spine 07/11/2016  . Contracture of elbow joint, left 07/11/2016  . Primary osteoarthritis of both knees 06/03/2016  . Rheumatoid arthritis of multiple sites with negative rheumatoid factor CCP negative, 14 33 eta negative 06/03/2016  . Abnormal SPEP 06/03/2016  . Elevated sed rate 06/03/2016    Past Surgical History:  Procedure Laterality Date  . EVALUATION UNDER ANESTHESIA WITH ANAL FISTULECTOMY N/A 06/30/2012   Procedure: EXAM UNDER ANESTHESIA  fistulotomy,  possible ;  Surgeon: Leighton Ruff, MD;  Location: [place];  Service: General;  Laterality: N/A;  . INCISION AND DRAINAGE PERIRECTAL ABSCESS  09/16/2011   Procedure: IRRIGATION AND DEBRIDEMENT PERIRECTAL ABSCESS;  Surgeon: Madilyn Hook, DO;  Location: [facility];  Service: General;  Laterality: N/A;       Home Medications    Prior to Admission medications   Medication Sig Start Date End Date Taking? Authorizing Provider  Naproxen-Esomeprazole (VIMOVO PO) Take by mouth.   Yes [provider]  ciprofloxacin (CIPRO) 500 MG tablet Take 1 tablet (500 mg total) by mouth 2 (two) times daily. 05/25/17   Roschelle Calandra, Wenda Overland, MD  colchicine 0.6 MG tablet Take 0.6 mg by mouth 2 (two) times daily.    [provider]  folic acid (FOLVITE) 1 MG tablet Take 2 tablets (2 mg total) by mouth daily. 06/16/16   Bo Merino, MD  methotrexate (RHEUMATREX) 2.5 MG tablet Take 6 tabs po per week x 2 week, then 8 tabs po weekly. Caution:Chemotherapy. Protect from light. 06/16/16   Bo Merino, MD  metroNIDAZOLE (FLAGYL) 500 MG tablet Take 1 tablet (500 mg total) by mouth 2 (two) times daily. 05/25/17   Leisl Spurrier, Wenda Overland, MD  ondansetron (ZOFRAN ODT) 4 MG disintegrating tablet Take 1 tablet (4 mg total) by mouth every 8 (eight) hours as needed for nausea or vomiting. 05/25/17   Demont Linford, Wenda Overland, MD  predniSONE (DELTASONE) 5 MG tablet 10 mg q AM x 1 week,  then 7.5 mg q Am x 1 week, then 5 mg q AM x 1 week, then 2.5 mg q Am x 1 week 07/30/16   Bo Merino, MD    Family History Family History  Problem Relation Age of Onset  . Cancer Father        lung    Social History Social History   Tobacco Use  . Smoking status: Former Smoker    Years: 6.00    Types: Cigarettes    Last attempt to quit: 07/10/2011    Years since quitting: 5.8  . Smokeless tobacco: Never Used  Substance Use Topics  . Alcohol use: No    Frequency: Never  . Drug use: No      Allergies   Meloxicam and Morphine and related   Review of Systems Review of Systems  Gastrointestinal: Positive for abdominal pain.   All other systems reviewed and are negative except that which was mentioned in HPI  Physical Exam Updated Vital Signs BP 96/67 (BP Location: Right Arm)   Pulse 82   Temp 98.6 F (37 C) (Oral)   Resp 18   Ht 6' 1.5" (1.867 m)   Wt 119.4 kg (263 lb 5 oz)   SpO2 96%   BMI 34.27 kg/m   Physical Exam  Constitutional: He is oriented to person, place, and time. He appears well-developed and well-nourished. No distress.  HENT:  Head: Normocephalic and atraumatic.  Moist mucous membranes  Eyes: Conjunctivae are normal.  Neck: Neck supple.  Cardiovascular: Normal rate, regular rhythm and normal heart sounds.  No murmur heard. Pulmonary/Chest: Effort normal and breath sounds normal.  Abdominal: Soft. Bowel sounds are normal. He exhibits no distension. There is tenderness. There is no rebound and no guarding.  Left-sided abdominal pain in mid and lower L abd  Musculoskeletal: He exhibits no edema.  Neurological: He is alert and oriented to person, place, and time.  Fluent speech  Skin: Skin is warm and dry.  Psychiatric: He has a normal mood and affect. Judgment normal.  Nursing note and vitals reviewed.    ED Treatments / Results  Labs (all labs ordered are listed, but only abnormal results are displayed) Labs Reviewed  COMPREHENSIVE METABOLIC PANEL - Abnormal; Notable for the following components:      Result Value   Potassium 3.1 (*)    Chloride 99 (*)    Glucose, Bld 110 (*)    Calcium 8.8 (*)    Total Protein 9.4 (*)    Albumin 2.7 (*)    All other components within normal limits  CBC WITH DIFFERENTIAL/PLATELET - Abnormal; Notable for the following components:   Hemoglobin 12.5 (*)    HCT 38.1 (*)    MCV 74.9 (*)    MCH 24.6 (*)    RDW 17.1 (*)    Platelets 499 (*)    All other components within normal limits   URINALYSIS, ROUTINE W REFLEX MICROSCOPIC - Abnormal; Notable for the following components:   Color, Urine AMBER (*)    Hgb urine dipstick SMALL (*)    All other components within normal limits  URINALYSIS, MICROSCOPIC (REFLEX) - Abnormal; Notable for the following components:   Bacteria, UA RARE (*)    Squamous Epithelial / LPF 0-5 (*)    All other components within normal limits  I-STAT CG4 LACTIC ACID, ED - Abnormal; Notable for the following components:   Lactic Acid, Venous 2.31 (*)    All other components within normal limits  I-STAT  CG4 LACTIC ACID, ED    EKG  EKG Interpretation None       Radiology No results found.  Procedures Procedures (including critical care time)  Medications Ordered in ED Medications  ondansetron (ZOFRAN) injection 4 mg (4 mg Intravenous Given 05/25/17 1813)  sodium chloride 0.9 % bolus 1,000 mL (0 mLs Intravenous Stopped 05/25/17 1950)  metroNIDAZOLE (FLAGYL) tablet 500 mg (500 mg Oral Given 05/25/17 2032)  ciprofloxacin (CIPRO) tablet 500 mg (500 mg Oral Given 05/25/17 2032)     Initial Impression / Assessment and Plan / ED Course  I have reviewed the triage vital signs and the nursing notes.  Pertinent labs & imaging results that were available during my care of the patient were reviewed by me and considered in my medical decision making (see chart for details).     PT w/ 5 days left-sided abdominal pain, decreased appetite, and nausea that are similar to previous episode of diverticulitis.  He was well-appearing on exam with reassuring vital signs.  He had left-sided abdominal tenderness with no distention or peritonitis.  Labs show initial lactate of 2.3 that improved to normal with fluids, UA without evidence of infection or blood, normal creatinine, normal LFTs, normal WBC count.  I discussed options with him including empiric treatment with antibiotics or CT scan to prove diverticulitis prior to initiation of antibiotics.  He felt  comfortable with empiric treatment and foregoing CT at this time which I feel is appropriate given no leukocytosis, no fever, and reassuring exam.  He was able to take first dose of oral medications with no problems in the ED and provided with Flagyl and Cipro as well as Zofran for home.  Instructed to see PCP in a few days for reassessment.  Extensively reviewed return precautions and patient voiced understanding.  Final Clinical Impressions(s) / ED Diagnoses   Final diagnoses:  Diverticulitis    ED Discharge Orders        Ordered    ciprofloxacin (CIPRO) 500 MG tablet  2 times daily     05/25/17 2114    metroNIDAZOLE (FLAGYL) 500 MG tablet  2 times daily     05/25/17 2114    ondansetron (ZOFRAN ODT) 4 MG disintegrating tablet  Every 8 hours PRN     05/25/17 2114       Landi Biscardi, Wenda Overland, MD 05/25/17 2333

## 2017-05-25 NOTE — ED Triage Notes (Signed)
Abdominal pain and nausea x 5 days. Hx of diverticulitis. This pain feels the same. Chills.

## 2017-05-25 NOTE — Discharge Instructions (Signed)
RETURN TO ER IF WORSENING PAIN, FEVER, VOMITING, OR BLOODY STOOLS.

## 2017-06-16 ENCOUNTER — Encounter: Payer: BLUE CROSS/BLUE SHIELD | Admitting: Family Medicine

## 2017-06-16 ENCOUNTER — Ambulatory Visit: Payer: BLUE CROSS/BLUE SHIELD | Admitting: Family Medicine

## 2017-06-16 ENCOUNTER — Encounter: Payer: Self-pay | Admitting: Family Medicine

## 2017-06-16 VITALS — BP 112/76 | HR 107 | Temp 97.9°F | Ht 73.5 in | Wt 263.4 lb

## 2017-06-16 DIAGNOSIS — M2550 Pain in unspecified joint: Secondary | ICD-10-CM | POA: Diagnosis not present

## 2017-06-16 DIAGNOSIS — M0609 Rheumatoid arthritis without rheumatoid factor, multiple sites: Secondary | ICD-10-CM | POA: Diagnosis not present

## 2017-06-16 MED ORDER — PREDNISONE 10 MG PO TABS: 10.0000 mg | ORAL_TABLET | Freq: Every day | ORAL | 1 refills | Status: DC

## 2017-06-16 MED ORDER — METHYLPREDNISOLONE ACETATE 80 MG/ML IJ SUSP
80.0000 mg | Freq: Once | INTRAMUSCULAR | Status: AC
  Administered 2017-06-16: 80 mg via INTRAMUSCULAR

## 2017-06-16 NOTE — Progress Notes (Deleted)
Chief Complaint  Patient presents with  . Establish Care       New Patient Visit SUBJECTIVE: HPI: Kerry Harrison is an 47 y.o.male who is being seen for establishing care.  Pt has a 15 yr hx of RA. He was being tx'd by Dr. Estanislado Pandy, but then when the Methotrexate wasn't working, he stopped following up. He has b/l ankle pain for which he sees podiatry for. His knees are painful as well as b/l wrists and L elbow. His hands are spared. He has been told in the past to cut down on uric acid increasing foods, but has never had fluid aspiration, urate levels checked or officially told he has gout. R knee is slightly swollen. No recent inj or change in activity.  Allergies  Allergen Reactions  . Meloxicam Diarrhea and Other (See Comments)    Dizziness  . Morphine And Related Other (See Comments)    LAST TIME GIVEN CAUSED HYPOTENSION    Past Medical History:  Diagnosis Date  . Anorectal fistula   . Arthritis   . Diverticulosis   . Osteoarthritis of hips, bilateral   . Osteoarthritis of lumbar spine   . Rheumatoid arthritis [facility])    Past Surgical History:  Procedure Laterality Date  . EVALUATION UNDER ANESTHESIA WITH ANAL FISTULECTOMY N/A 06/30/2012   Procedure: EXAM UNDER ANESTHESIA  fistulotomy, possible ;  Surgeon: Leighton Ruff, MD;  Location: [place];  Service: General;  Laterality: N/A;  . INCISION AND DRAINAGE PERIRECTAL ABSCESS  09/16/2011   Procedure: IRRIGATION AND DEBRIDEMENT PERIRECTAL ABSCESS;  Surgeon: Madilyn Hook, DO;  Location: [facility];  Service: General;  Laterality: N/A;   Social History   Socioeconomic History  . Marital status: Married  Tobacco Use  . Smoking status: Former Smoker    Years: 6.00    Types: Cigarettes    Last attempt to quit: 07/10/2011    Years since quitting: 5.9  . Smokeless tobacco: Never Used  Substance and Sexual Activity  . Alcohol use: No    Frequency: Never  . Drug use: No   Family History  Problem Relation Age of  Onset  . Cancer Father        lung  . COPD Father   . Diabetes Maternal Grandmother   . Alcohol abuse Paternal Grandfather   . Arthritis Paternal Grandfather   . Cancer Paternal Grandfather      Current Outpatient Medications:  .  colchicine 0.6 MG tablet, Take 0.6 mg by mouth 2 (two) times daily., Disp: , Rfl:  .  folic acid (FOLVITE) 1 MG tablet, Take 2 tablets (2 mg total) by mouth daily. (Patient not taking: Reported on 06/16/2017), Disp: 180 tablet, Rfl: 4 .  methotrexate (RHEUMATREX) 2.5 MG tablet, Take 6 tabs po per week x 2 week, then 8 tabs po weekly. Caution:Chemotherapy. Protect from light. (Patient not taking: Reported on 06/16/2017), Disp: 32 tablet, Rfl: 2 .  Naproxen-Esomeprazole (VIMOVO PO), Take by mouth., Disp: , Rfl:  .  ondansetron (ZOFRAN ODT) 4 MG disintegrating tablet, Take 1 tablet (4 mg total) by mouth every 8 (eight) hours as needed for nausea or vomiting. (Patient not taking: Reported on 06/16/2017), Disp: 8 tablet, Rfl: 0 .  predniSONE (DELTASONE) 10 MG tablet, Take 1 tablet (10 mg total) by mouth daily with breakfast. Take 2 tabs for the first 7 days., Disp: 60 tablet, Rfl: 1  ROS Skin: Denies rash  MSK: +jt pain   OBJECTIVE: BP 112/76 (BP Location: Left Arm,  Patient Position: Sitting, Cuff Size: Large)   Pulse (!) 107   Temp 97.9 F (36.6 C) (Oral)   Ht 6' 1.5" (1.867 m)   Wt 263 lb 6 oz (119.5 kg)   SpO2 97%   BMI 34.28 kg/m   Constitutional: -  VS reviewed -  Well developed, well nourished, appears stated age -  No apparent distress  Psychiatric: -  Oriented to person, place, and time -  Memory intact -  Affect and mood normal -  Fluent conversation, good eye contact -  Judgment and insight age appropriate  Eye: -  Conjunctivae clear, no discharge -  Pupils symmetric, round, reactive to light  ENMT: -  MMM    Pharynx moist, no exudate, no erythema    Appropriate pool of saliva subglossal  Neck: -  No gross swelling, no palpable masses -   Thyroid midline, not enlarged, mobile, no palpable masses  Cardiovascular: -  RRR -  No LE edema  Respiratory: -  Normal respiratory effort, no accessory muscle use, no retraction -  Breath sounds equal, no wheezes, no ronchi, no crackles  Neurological:  -  CN II - XII grossly intact -  Sensation grossly intact to light touch, equal bilaterally  Musculoskeletal: -  +Antalgic gait -  +TTP over dorsal carpal bones b/l, no edema -  +TTP over lat epicondyles b/l -  +effusion over R knee, decreased ROM; no structural deficits or ttp -   +mild effusion on L knee, decreased ROM to lesser extent compared to R, no ttp noted  Skin: -  No significant lesion on inspection -  Warm and dry to palpation   ASSESSMENT/PLAN: Rheumatoid arthritis of multiple sites with negative rheumatoid factor ([place]) - Plan: Ambulatory referral to Rheumatology, methylPREDNISolone acetate (DEPO-MEDROL) injection 80 mg  Arthralgia, unspecified joint - Plan: Uric acid, methylPREDNISolone acetate (DEPO-MEDROL) injection 80 mg  Patient instructed to sign release of records form from his previous PCP. Refer to another rheum team per pt request. Start pred. Ck urate.  Patient should return for CPE at earliest convenience when this issue with his jts is figured out. The patient voiced understanding and agreement to the plan.   [place], DO 06/16/17  5:13 PM Chief Complaint  Patient presents with  . Establish Care       New Patient Visit SUBJECTIVE: HPI: Kerry Harrison is an 47 y.o.male who is being seen for establishing care.  The patient was previously seen at ***.  Allergies  Allergen Reactions  . Meloxicam Diarrhea and Other (See Comments)    Dizziness  . Morphine And Related Other (See Comments)    LAST TIME GIVEN CAUSED HYPOTENSION    Past Medical History:  Diagnosis Date  . Anorectal fistula   . Arthritis   . Diverticulosis   . Osteoarthritis of hips, bilateral   . Osteoarthritis of  lumbar spine   . Rheumatoid arthritis [facility])    Past Surgical History:  Procedure Laterality Date  . EVALUATION UNDER ANESTHESIA WITH ANAL FISTULECTOMY N/A 06/30/2012   Procedure: EXAM UNDER ANESTHESIA  fistulotomy, possible ;  Surgeon: Leighton Ruff, MD;  Location: [place];  Service: General;  Laterality: N/A;  . INCISION AND DRAINAGE PERIRECTAL ABSCESS  09/16/2011   Procedure: IRRIGATION AND DEBRIDEMENT PERIRECTAL ABSCESS;  Surgeon: Madilyn Hook, DO;  Location: [facility];  Service: General;  Laterality: N/A;   Social History   Socioeconomic History  . Marital status: Married    Spouse name:  Not on file  . Number of children: Not on file  . Years of education: Not on file  . Highest education level: Not on file  Social Needs  . Financial resource strain: Not on file  . Food insecurity - worry: Not on file  . Food insecurity - inability: Not on file  . Transportation needs - medical: Not on file  . Transportation needs - non-medical: Not on file  Occupational History  . Not on file  Tobacco Use  . Smoking status: Former Smoker    Years: 6.00    Types: Cigarettes    Last attempt to quit: 07/10/2011    Years since quitting: 5.9  . Smokeless tobacco: Never Used  Substance and Sexual Activity  . Alcohol use: No    Frequency: Never  . Drug use: No  . Sexual activity: Not Currently  Other Topics Concern  . Not on file  Social History Narrative  . Not on file   Family History  Problem Relation Age of Onset  . Cancer Father        lung  . COPD Father   . Diabetes Maternal Grandmother   . Alcohol abuse Paternal Grandfather   . Arthritis Paternal Grandfather   . Cancer Paternal Grandfather      Current Outpatient Medications:  .  colchicine 0.6 MG tablet, Take 0.6 mg by mouth 2 (two) times daily., Disp: , Rfl:  .  folic acid (FOLVITE) 1 MG tablet, Take 2 tablets (2 mg total) by mouth daily. (Patient not taking: Reported on 06/16/2017), Disp: 180 tablet, Rfl:  4 .  methotrexate (RHEUMATREX) 2.5 MG tablet, Take 6 tabs po per week x 2 week, then 8 tabs po weekly. Caution:Chemotherapy. Protect from light. (Patient not taking: Reported on 06/16/2017), Disp: 32 tablet, Rfl: 2 .  Naproxen-Esomeprazole (VIMOVO PO), Take by mouth., Disp: , Rfl:  .  ondansetron (ZOFRAN ODT) 4 MG disintegrating tablet, Take 1 tablet (4 mg total) by mouth every 8 (eight) hours as needed for nausea or vomiting. (Patient not taking: Reported on 06/16/2017), Disp: 8 tablet, Rfl: 0 .  predniSONE (DELTASONE) 10 MG tablet, Take 1 tablet (10 mg total) by mouth daily with breakfast. Take 2 tabs for the first 7 days., Disp: 60 tablet, Rfl: 1  ROS Cardiovascular: Denies chest pain  Respiratory: Denies dyspnea   OBJECTIVE: BP 112/76 (BP Location: Left Arm, Patient Position: Sitting, Cuff Size: Large)   Pulse (!) 107   Temp 97.9 F (36.6 C) (Oral)   Ht 6' 1.5" (1.867 m)   Wt 263 lb 6 oz (119.5 kg)   SpO2 97%   BMI 34.28 kg/m   Constitutional: -  VS reviewed -  Well developed, well nourished, appears stated age -  No apparent distress  Psychiatric: -  Oriented to person, place, and time -  Memory intact -  Affect and mood normal -  Fluent conversation, good eye contact -  Judgment and insight age appropriate  Eye: -  Conjunctivae clear, no discharge -  Pupils symmetric, round, reactive to light  ENMT: -  MMM    Pharynx moist, no exudate, no erythema  Neck: -  No gross swelling, no palpable masses -  Thyroid midline, not enlarged, mobile, no palpable masses  Cardiovascular: -  RRR -  No LE edema  Respiratory: -  Normal respiratory effort, no accessory muscle use, no retraction -  Breath sounds equal, no wheezes, no ronchi, no crackles  Gastrointestinal: -  Bowel sounds normal -  No tenderness, no distention, no guarding, no masses  Neurological:  -  CN II - XII grossly intact -  Sensation grossly intact to light touch, equal bilaterally  Musculoskeletal: -  No clubbing,  no cyanosis -  Gait normal  Skin: -  No significant lesion on inspection -  Warm and dry to palpation   ASSESSMENT/PLAN: Rheumatoid arthritis of multiple sites with negative rheumatoid factor ([place]) - Plan: Ambulatory referral to Rheumatology, methylPREDNISolone acetate (DEPO-MEDROL) injection 80 mg  Arthralgia, unspecified joint - Plan: Uric acid, methylPREDNISolone acetate (DEPO-MEDROL) injection 80 mg  Patient instructed to sign release of records form from her previous PCP. Patient should return ***. The patient voiced understanding and agreement to the plan.   [place], DO 06/16/17  5:13 PM

## 2017-06-16 NOTE — Patient Instructions (Signed)
If you do not hear anything about your referral in the next week, call our office and ask for an update.  Ice/cold pack over area for 10-15 min twice daily.  OK to take Tylenol 1000 mg (2 extra strength tabs) or 975 mg (3 regular strength tabs) every 6 hours as needed.  Let us know if you need anything.

## 2017-06-16 NOTE — Progress Notes (Signed)
Chief Complaint  Patient presents with  . Establish Care                                        New Patient Visit SUBJECTIVE: HPI: Kerry Harrison is an 47 y.o.male who is being seen for establishing care.  Pt has a 15 yr hx of RA. He was being tx'd by Dr. Estanislado Pandy, but then when the Methotrexate wasn't working, he stopped following up. He has b/l ankle pain for which he sees podiatry for. His knees are painful as well as b/l wrists and L elbow. His hands are spared. He has been told in the past to cut down on uric acid increasing foods, but has never had fluid aspiration, urate levels checked or officially told he has gout. R knee is slightly swollen. No recent inj or change in activity.       Allergies  Allergen Reactions  . Meloxicam Diarrhea and Other (See Comments)    Dizziness  . Morphine And Related Other (See Comments)    LAST TIME GIVEN CAUSED HYPOTENSION        Past Medical History:  Diagnosis Date  . Anorectal fistula   . Arthritis   . Diverticulosis   . Osteoarthritis of hips, bilateral   . Osteoarthritis of lumbar spine   . Rheumatoid arthritis [facility])         Past Surgical History:  Procedure Laterality Date  . EVALUATION UNDER ANESTHESIA WITH ANAL FISTULECTOMY N/A 06/30/2012   Procedure: EXAM UNDER ANESTHESIA  fistulotomy, possible ;  Surgeon: Leighton Ruff, MD;  Location: [place];  Service: General;  Laterality: N/A;  . INCISION AND DRAINAGE PERIRECTAL ABSCESS  09/16/2011   Procedure: IRRIGATION AND DEBRIDEMENT PERIRECTAL ABSCESS;  Surgeon: Madilyn Hook, DO;  Location: [facility];  Service: General;  Laterality: N/A;   Social History        Socioeconomic History  . Marital status: Married  Tobacco Use  . Smoking status: Former Smoker    Years: 6.00    Types: Cigarettes    Last attempt to quit: 07/10/2011    Years since quitting: 5.9  . Smokeless tobacco: Never Used  Substance and Sexual Activity  . Alcohol use:  No    Frequency: Never  . Drug use: No        Family History  Problem Relation Age of Onset  . Cancer Father        lung  . COPD Father   . Diabetes Maternal Grandmother   . Alcohol abuse Paternal Grandfather   . Arthritis Paternal Grandfather   . Cancer Paternal Grandfather      Current Outpatient Medications:  .  colchicine 0.6 MG tablet, Take 0.6 mg by mouth 2 (two) times daily., Disp: , Rfl:  .  folic acid (FOLVITE) 1 MG tablet, Take 2 tablets (2 mg total) by mouth daily. (Patient not taking: Reported on 06/16/2017), Disp: 180 tablet, Rfl: 4 .  methotrexate (RHEUMATREX) 2.5 MG tablet, Take 6 tabs po per week x 2 week, then 8 tabs po weekly. Caution:Chemotherapy. Protect from light. (Patient not taking: Reported on 06/16/2017), Disp: 32 tablet, Rfl: 2 .  Naproxen-Esomeprazole (VIMOVO PO), Take by mouth., Disp: , Rfl:  .  ondansetron (ZOFRAN ODT) 4 MG disintegrating tablet, Take 1 tablet (4 mg total) by mouth every 8 (eight) hours as needed for nausea or vomiting. (Patient not taking:  Reported on 06/16/2017), Disp: 8 tablet, Rfl: 0 .  predniSONE (DELTASONE) 10 MG tablet, Take 1 tablet (10 mg total) by mouth daily with breakfast. Take 2 tabs for the first 7 days., Disp: 60 tablet, Rfl: 1  ROS Skin: Denies rash  MSK: +jt pain   OBJECTIVE: BP 112/76 (BP Location: Left Arm, Patient Position: Sitting, Cuff Size: Large)   Pulse (!) 107   Temp 97.9 F (36.6 C) (Oral)   Ht 6' 1.5" (1.867 m)   Wt 263 lb 6 oz (119.5 kg)   SpO2 97%   BMI 34.28 kg/m   Constitutional: -  VS reviewed -  Well developed, well nourished, appears stated age -  No apparent distress  Psychiatric: -  Oriented to person, place, and time -  Memory intact -  Affect and mood normal -  Fluent conversation, good eye contact -  Judgment and insight age appropriate  Eye: -  Conjunctivae clear, no discharge -  Pupils symmetric, round, reactive to light  ENMT: -  MMM    Pharynx moist, no exudate,  no erythema    Appropriate pool of saliva subglossal  Neck: -  No gross swelling, no palpable masses -  Thyroid midline, not enlarged, mobile, no palpable masses  Cardiovascular: -  RRR -  No LE edema  Respiratory: -  Normal respiratory effort, no accessory muscle use, no retraction -  Breath sounds equal, no wheezes, no ronchi, no crackles  Neurological:  -  CN II - XII grossly intact -  Sensation grossly intact to light touch, equal bilaterally  Musculoskeletal: -  +Antalgic gait -  +TTP over dorsal carpal bones b/l, no edema -  +TTP over lat epicondyles b/l -  +effusion over R knee, decreased ROM; no structural deficits or ttp -   +mild effusion on L knee, decreased ROM to lesser extent compared to R, no ttp noted  Skin: -  No significant lesion on inspection -  Warm and dry to palpation   ASSESSMENT/PLAN: Rheumatoid arthritis of multiple sites with negative rheumatoid factor ([place]) - Plan: Ambulatory referral to Rheumatology, methylPREDNISolone acetate (DEPO-MEDROL) injection 80 mg  Arthralgia, unspecified joint - Plan: Uric acid, methylPREDNISolone acetate (DEPO-MEDROL) injection 80 mg  Patient instructed to sign release of records form from his previous PCP. Refer to another rheum team per pt request. Start pred. Ck urate.  Patient should return for CPE at earliest convenience when this issue with his jts is figured out. The patient voiced understanding and agreement to the plan.   [place], DO 06/16/17  5:13 PM

## 2017-06-16 NOTE — Progress Notes (Signed)
Pre visit review using our clinic review tool, if applicable. No additional management support is needed unless otherwise documented below in the visit note. 

## 2017-06-17 ENCOUNTER — Encounter: Payer: Self-pay | Admitting: Family Medicine

## 2017-06-21 ENCOUNTER — Emergency Department (HOSPITAL_BASED_OUTPATIENT_CLINIC_OR_DEPARTMENT_OTHER)
Admission: EM | Admit: 2017-06-21 | Discharge: 2017-06-21 | Disposition: A | Discharge status: Home / Self Care | Payer: BLUE CROSS/BLUE SHIELD | Attending: Emergency Medicine | Admitting: Emergency Medicine

## 2017-06-21 ENCOUNTER — Other Ambulatory Visit: Payer: Self-pay

## 2017-06-21 ENCOUNTER — Emergency Department (HOSPITAL_BASED_OUTPATIENT_CLINIC_OR_DEPARTMENT_OTHER): Payer: BLUE CROSS/BLUE SHIELD

## 2017-06-21 ENCOUNTER — Encounter (HOSPITAL_BASED_OUTPATIENT_CLINIC_OR_DEPARTMENT_OTHER): Payer: Self-pay | Admitting: Emergency Medicine

## 2017-06-21 DIAGNOSIS — B349 Viral infection, unspecified: Secondary | ICD-10-CM | POA: Insufficient documentation

## 2017-06-21 DIAGNOSIS — R197 Diarrhea, unspecified: Secondary | ICD-10-CM | POA: Diagnosis not present

## 2017-06-21 DIAGNOSIS — R05 Cough: Secondary | ICD-10-CM | POA: Diagnosis not present

## 2017-06-21 DIAGNOSIS — R070 Pain in throat: Secondary | ICD-10-CM | POA: Diagnosis present

## 2017-06-21 DIAGNOSIS — E860 Dehydration: Secondary | ICD-10-CM | POA: Diagnosis not present

## 2017-06-21 DIAGNOSIS — Z87891 Personal history of nicotine dependence: Secondary | ICD-10-CM | POA: Diagnosis not present

## 2017-06-21 LAB — CBC WITH DIFFERENTIAL/PLATELET
BASOS ABS: 0 10*3/uL (ref 0.0–0.1)
Basophils Relative: 0 %
Eosinophils Absolute: 0.2 10*3/uL (ref 0.0–0.7)
Eosinophils Relative: 2 %
HCT: 39.7 % (ref 39.0–52.0)
Hemoglobin: 12.9 g/dL — ABNORMAL LOW (ref 13.0–17.0)
LYMPHS PCT: 36 %
Lymphs Abs: 2.9 10*3/uL (ref 0.7–4.0)
MCH: 25 pg — AB (ref 26.0–34.0)
MCHC: 32.5 g/dL (ref 30.0–36.0)
MCV: 76.9 fL — AB (ref 78.0–100.0)
Monocytes Absolute: 1.1 10*3/uL — ABNORMAL HIGH (ref 0.1–1.0)
Monocytes Relative: 14 %
Neutro Abs: 3.7 10*3/uL (ref 1.7–7.7)
Neutrophils Relative %: 48 %
Platelets: 414 10*3/uL — ABNORMAL HIGH (ref 150–400)
RBC: 5.16 MIL/uL (ref 4.22–5.81)
RDW: 18.6 % — AB (ref 11.5–15.5)
WBC: 7.9 10*3/uL (ref 4.0–10.5)

## 2017-06-21 LAB — BASIC METABOLIC PANEL
ANION GAP: 12 (ref 5–15)
BUN: 11 mg/dL (ref 6–20)
CALCIUM: 8.9 mg/dL (ref 8.9–10.3)
CO2: 23 mmol/L (ref 22–32)
Chloride: 99 mmol/L — ABNORMAL LOW (ref 101–111)
Creatinine, Ser: 0.93 mg/dL (ref 0.61–1.24)
GFR calc Af Amer: >60 mL/min (ref >60)
GLUCOSE: 97 mg/dL (ref 65–99)
Potassium: 3.5 mmol/L (ref 3.5–5.1)
Sodium: 134 mmol/L — ABNORMAL LOW (ref 135–145)

## 2017-06-21 LAB — TROPONIN I: Troponin I: <0.03 ng/mL (ref <0.03)

## 2017-06-21 MED ORDER — IOPAMIDOL (ISOVUE-370) INJECTION 76%
100.0000 mL | Freq: Once | INTRAVENOUS | Status: AC | PRN
  Administered 2017-06-21: 100 mL via INTRAVENOUS

## 2017-06-21 MED ORDER — BENZONATATE 100 MG PO CAPS
100.0000 mg | ORAL_CAPSULE | Freq: Three times a day (TID) | ORAL | 0 refills | Status: DC
Start: 2017-06-21 — End: 2017-12-20

## 2017-06-21 MED ORDER — DICYCLOMINE HCL 20 MG PO TABS: 20.0000 mg | ORAL_TABLET | Freq: Two times a day (BID) | ORAL | 0 refills | Status: DC

## 2017-06-21 MED ORDER — SODIUM CHLORIDE 0.9 % IV BOLUS
1000.0000 mL | Freq: Once | INTRAVENOUS | Status: AC
  Administered 2017-06-21: 1000 mL via INTRAVENOUS

## 2017-06-21 MED ORDER — SODIUM CHLORIDE 0.9 % IV BOLUS: 1000.0000 mL | Freq: Once | INTRAVENOUS | Status: DC

## 2017-06-21 MED ORDER — BENZONATATE 100 MG PO CAPS
200.0000 mg | ORAL_CAPSULE | Freq: Once | ORAL | Status: AC
  Administered 2017-06-21: 200 mg via ORAL
  Filled 2017-06-21: qty 2

## 2017-06-21 MED FILL — BENZONATATE 100 MG CAPSULE: 100 | 7 days supply | Qty: 21 | Fill #0

## 2017-06-21 MED FILL — DICYCLOMINE 20 MG TABLET: 20 | 10 days supply | Qty: 20 | Fill #0

## 2017-06-21 NOTE — ED Provider Notes (Signed)
[facility] EMERGENCY DEPARTMENT Provider Note   CSN: [id number] Arrival date & time: 06/21/17  1307     History   Chief Complaint Chief Complaint  Patient presents with  . Sore Throat  . Cough    HPI Kerry Harrison is a 47 y.o. male.  The history is provided by the patient.  Sore Throat  This is a new problem. The current episode started 2 days ago. The problem occurs constantly. The problem has not changed since onset.Pertinent negatives include no chest pain, no abdominal pain, no headaches and no shortness of breath. Nothing aggravates the symptoms. Nothing relieves the symptoms. He has tried nothing for the symptoms. The treatment provided no relief.  Cough  This is a new problem. The current episode started more than 2 days ago. The problem occurs constantly. The problem has not changed since onset.The cough is non-productive. There has been no fever. Pertinent negatives include no chest pain, no headaches and no shortness of breath. He has tried nothing for the symptoms. The treatment provided no relief. He is not a smoker. His past medical history does not include pneumonia.  Diarrhea   This is a new problem. The current episode started 2 days ago. The problem occurs 2 to 4 times per day. The problem has not changed since onset.The stool consistency is described as watery. There has been no fever. Associated symptoms include cough. Pertinent negatives include no abdominal pain, no vomiting and no headaches. He has tried nothing for the symptoms. The treatment provided no relief. Risk factors include ill contacts. His past medical history does not include irritable bowel syndrome.    Past Medical History:  Diagnosis Date  . Anorectal fistula   . Diverticulosis   . Osteoarthritis of hips, bilateral   . Osteoarthritis of lumbar spine   . Rheumatoid arthritis [facility])     Patient Active Problem List   Diagnosis Date Noted  . Primary osteoarthritis of both hands  07/11/2016  . Bilateral primary osteoarthritis of hip 07/11/2016  . Primary osteoarthritis of both feet 07/11/2016  . Osteoarthritis of lumbar spine 07/11/2016  . Contracture of elbow joint, left 07/11/2016  . Primary osteoarthritis of both knees 06/03/2016  . Rheumatoid arthritis of multiple sites with negative rheumatoid factor CCP negative, 14 33 eta negative 06/03/2016  . Abnormal SPEP 06/03/2016  . Elevated sed rate 06/03/2016    Past Surgical History:  Procedure Laterality Date  . EVALUATION UNDER ANESTHESIA WITH ANAL FISTULECTOMY N/A 06/30/2012   Procedure: EXAM UNDER ANESTHESIA  fistulotomy, possible ;  Surgeon: Leighton Ruff, MD;  Location: [facility];  Service: General;  Laterality: N/A;  . INCISION AND DRAINAGE PERIRECTAL ABSCESS  09/16/2011   Procedure: IRRIGATION AND DEBRIDEMENT PERIRECTAL ABSCESS;  Surgeon: Madilyn Hook, DO;  Location: [facility];  Service: General;  Laterality: N/A;  . OTHER SURGICAL HISTORY     bilateral knee surg; unsure what kind        Home Medications    Prior to Admission medications   Medication Sig Start Date End Date Taking? Authorizing Provider  colchicine 0.6 MG tablet Take 0.6 mg by mouth 2 (two) times daily.    [provider]  folic acid (FOLVITE) 1 MG tablet Take 2 tablets (2 mg total) by mouth daily. Patient not taking: Reported on 06/16/2017 06/16/16   Bo Merino, MD  methotrexate (RHEUMATREX) 2.5 MG tablet Take 6 tabs po per week x 2 week, then 8 tabs po weekly. Caution:Chemotherapy. Protect from  light. Patient not taking: Reported on 06/16/2017 06/16/16   Bo Merino, MD  Naproxen-Esomeprazole (VIMOVO PO) Take by mouth.    [provider]  ondansetron (ZOFRAN ODT) 4 MG disintegrating tablet Take 1 tablet (4 mg total) by mouth every 8 (eight) hours as needed for nausea or vomiting. Patient not taking: Reported on 06/16/2017 05/25/17   Little, Wenda Overland, MD  predniSONE (DELTASONE) 10 MG tablet  Take 1 tablet (10 mg total) by mouth daily with breakfast. Take 2 tabs for the first 7 days. 06/16/17   Shelda Pal, DO    Family History Family History  Problem Relation Age of Onset  . Cancer Father        lung  . COPD Father   . Diabetes Maternal Grandmother   . Alcohol abuse Paternal Grandfather   . Arthritis Paternal Grandfather   . Cancer Paternal Grandfather     Social History Social History   Tobacco Use  . Smoking status: Former Smoker    Years: 6.00    Types: Cigarettes    Last attempt to quit: 07/10/2011    Years since quitting: 5.9  . Smokeless tobacco: Never Used  Substance Use Topics  . Alcohol use: No    Frequency: Never  . Drug use: No     Allergies   Meloxicam and Morphine and related   Review of Systems Review of Systems  Constitutional: Negative for fever.  Respiratory: Positive for cough. Negative for shortness of breath.   Cardiovascular: Negative for chest pain, palpitations and leg swelling.  Gastrointestinal: Positive for diarrhea. Negative for abdominal pain and vomiting.  Neurological: Negative for headaches.  All other systems reviewed and are negative.    Physical Exam Updated Vital Signs BP 105/73   Pulse 85   Temp 98.2 F (36.8 C) (Oral)   Resp 19   Ht 6' 1"  (1.854 m)   Wt 119.3 kg (263 lb)   SpO2 95%   BMI 34.70 kg/m   Physical Exam  Constitutional: He is oriented to person, place, and time. He appears well-developed and well-nourished. No distress.  HENT:  Head: Normocephalic and atraumatic.  Mouth/Throat: No oropharyngeal exudate.  Eyes: Pupils are equal, round, and reactive to light. Conjunctivae are normal.  Neck: Normal range of motion. Neck supple.  Cardiovascular: Normal rate, regular rhythm, normal heart sounds and intact distal pulses.  Pulmonary/Chest: Effort normal and breath sounds normal. No stridor. He has no wheezes. He has no rales.  Abdominal: Soft. Bowel sounds are normal. He exhibits no  mass. There is no tenderness. There is no rebound and no guarding.  Musculoskeletal: Normal range of motion. He exhibits no edema.  Neurological: He is alert and oriented to person, place, and time. He displays normal reflexes.  Skin: Skin is warm and dry. Capillary refill takes less than 2 seconds.  Psychiatric: He has a normal mood and affect.  Nursing note and vitals reviewed.    ED Treatments / Results  Labs (all labs ordered are listed, but only abnormal results are displayed)  Results for orders placed or performed during the hospital encounter of 06/21/17  CBC with Differential/Platelet  Result Value Ref Range   WBC 7.9 4.0 - 10.5 K/uL   RBC 5.16 4.22 - 5.81 MIL/uL   Hemoglobin 12.9 (L) 13.0 - 17.0 g/dL   HCT 39.7 39.0 - 52.0 %   MCV 76.9 (L) 78.0 - 100.0 fL   MCH 25.0 (L) 26.0 - 34.0 pg   MCHC 32.5 30.0 -  36.0 g/dL   RDW 18.6 (H) 11.5 - 15.5 %   Platelets 414 (H) 150 - 400 K/uL   Neutrophils Relative % 48 %   Neutro Abs 3.7 1.7 - 7.7 K/uL   Lymphocytes Relative 36 %   Lymphs Abs 2.9 0.7 - 4.0 K/uL   Monocytes Relative 14 %   Monocytes Absolute 1.1 (H) 0.1 - 1.0 K/uL   Eosinophils Relative 2 %   Eosinophils Absolute 0.2 0.0 - 0.7 K/uL   Basophils Relative 0 %   Basophils Absolute 0.0 0.0 - 0.1 K/uL  Basic metabolic panel  Result Value Ref Range   Sodium 134 (L) 135 - 145 mmol/L   Potassium 3.5 3.5 - 5.1 mmol/L   Chloride 99 (L) 101 - 111 mmol/L   CO2 23 22 - 32 mmol/L   Glucose, Bld 97 65 - 99 mg/dL   BUN 11 6 - 20 mg/dL   Creatinine, Ser 0.93 0.61 - 1.24 mg/dL   Calcium 8.9 8.9 - 10.3 mg/dL   GFR calc non Af Amer >60 >60 mL/min   GFR calc Af Amer >60 >60 mL/min   Anion gap 12 5 - 15  Troponin I  Result Value Ref Range   Troponin I <0.03 <0.03 ng/mL   Dg Chest 2 View  Result Date: 06/21/2017 CLINICAL DATA:  Cough, cold symptoms EXAM: CHEST - 2 VIEW COMPARISON:  06/13/2016 FINDINGS: Heart and mediastinal contours are within normal limits. No focal opacities  or effusions. No acute bony abnormality. IMPRESSION: No active cardiopulmonary disease. Electronically Signed   By: Rolm Baptise M.D.   On: 06/21/2017 14:23   Ct Angio Chest Pe W And/or Wo Contrast  Result Date: 06/21/2017 CLINICAL DATA:  Cough, sore throat, diarrhea, burning for 2 days EXAM: CT ANGIOGRAPHY CHEST WITH CONTRAST TECHNIQUE: Multidetector CT imaging of the chest was performed using the standard protocol during bolus administration of intravenous contrast. Multiplanar CT image reconstructions and MIPs were obtained to evaluate the vascular anatomy. CONTRAST:  117m ISOVUE-370 IOPAMIDOL (ISOVUE-370) INJECTION 76% COMPARISON:  None. FINDINGS: Cardiovascular: Satisfactory opacification of the pulmonary arteries to the lobar level. No evidence of pulmonary embolism. Normal heart size. No pericardial effusion. Normal caliber thoracic aorta. Mediastinum/Nodes: No enlarged mediastinal, hilar, or axillary lymph nodes. Thyroid gland, trachea, and esophagus demonstrate no significant findings. Lungs/Pleura: Lungs are clear. No pleural effusion or pneumothorax. Mild left lower lobe atelectasis. Upper Abdomen: No acute abnormality. Musculoskeletal: No chest wall abnormality. No acute or significant osseous findings. Review of the MIP images confirms the above findings. IMPRESSION: 1. Satisfactory opacification of the pulmonary arteries to the lobar level. No evidence of pulmonary embolism. Segmental and subsegmental branches are suboptimally visualized. Electronically Signed   By: HKathreen Devoid  On: 06/21/2017 15:16    EKG EKG Interpretation  Date/Time:  Monday June 21 2017 13:29:28 EDT Ventricular Rate:  79 PR Interval:    QRS Duration: 92 QT Interval:  360 QTC Calculation: 413 R Axis:   35 Text Interpretation:  Sinus rhythm Confirmed by PDory Horn on 06/21/2017 2:31:52 PM   Radiology Dg Chest 2 View  Result Date: 06/21/2017 CLINICAL DATA:  Cough, cold symptoms EXAM: CHEST - 2  VIEW COMPARISON:  06/13/2016 FINDINGS: Heart and mediastinal contours are within normal limits. No focal opacities or effusions. No acute bony abnormality. IMPRESSION: No active cardiopulmonary disease. Electronically Signed   By: KRolm BaptiseM.D.   On: 06/21/2017 14:23   Ct Angio Chest Pe W And/or Wo Contrast  Result Date: 06/21/2017 CLINICAL DATA:  Cough, sore throat, diarrhea, burning for 2 days EXAM: CT ANGIOGRAPHY CHEST WITH CONTRAST TECHNIQUE: Multidetector CT imaging of the chest was performed using the standard protocol during bolus administration of intravenous contrast. Multiplanar CT image reconstructions and MIPs were obtained to evaluate the vascular anatomy. CONTRAST:  168m ISOVUE-370 IOPAMIDOL (ISOVUE-370) INJECTION 76% COMPARISON:  None. FINDINGS: Cardiovascular: Satisfactory opacification of the pulmonary arteries to the lobar level. No evidence of pulmonary embolism. Normal heart size. No pericardial effusion. Normal caliber thoracic aorta. Mediastinum/Nodes: No enlarged mediastinal, hilar, or axillary lymph nodes. Thyroid gland, trachea, and esophagus demonstrate no significant findings. Lungs/Pleura: Lungs are clear. No pleural effusion or pneumothorax. Mild left lower lobe atelectasis. Upper Abdomen: No acute abnormality. Musculoskeletal: No chest wall abnormality. No acute or significant osseous findings. Review of the MIP images confirms the above findings. IMPRESSION: 1. Satisfactory opacification of the pulmonary arteries to the lobar level. No evidence of pulmonary embolism. Segmental and subsegmental branches are suboptimally visualized. Electronically Signed   By: HKathreen Devoid  On: 06/21/2017 15:16    Procedures Procedures (including critical care time)  Medications Ordered in ED Orthostatic VS for the past 24 hrs:  BP- Lying Pulse- Lying BP- Sitting Pulse- Sitting BP- Standing at 0 minutes Pulse- Standing at 0 minutes  06/21/17 1343 122/76 75 116/82 86 94/79 104        Final Clinical Impressions(s) / ED Diagnoses   Dehydration with orthostasis as cause of syncope.    Return for weakness, numbness, changes in vision or speech, fevers >100.4 unrelieved by medication, shortness of breath, intractable vomiting, or diarrhea, abdominal pain, Inability to tolerate liquids or food, cough, altered mental status or any concerns. No signs of systemic illness or infection. The patient is nontoxic-appearing on exam and vital signs are within normal limits.   I have reviewed the triage vital signs and the nursing notes. Pertinent labs &imaging results that were available during my care of the patient were reviewed by me and considered in my medical decision making (see chart for details).  After history, exam, and medical workup I feel the patient has been appropriately medically screened and is safe for discharge home. Pertinent diagnoses were discussed with the patient. Patient was given return precautions.     Lorren Rossetti, MD 06/21/17 1550

## 2017-06-21 NOTE — ED Notes (Signed)
Patient transported to CT 

## 2017-06-21 NOTE — ED Triage Notes (Addendum)
Cough with burning in his chest, sore throat, diarrhea x 2 days. Pt had a syncopal episode at triage.

## 2017-07-02 ENCOUNTER — Telehealth: Payer: Self-pay | Admitting: *Deleted

## 2017-07-02 NOTE — Telephone Encounter (Signed)
Received FMLA/STD paperwork from [employer], completed as much as possible; LMOM with contact name and number for return call RE: dates on paperwork needed before forwarding to provider/SLS 04/05

## 2017-07-02 NOTE — Telephone Encounter (Signed)
[phone]  Pt is returning call  Pt is at work from 6 am to 330 pm, please leave message during that time

## 2017-07-08 NOTE — Telephone Encounter (Signed)
Patient checking status, FMLA paperwork is due on Monday. Call back [phone]

## 2017-07-09 NOTE — Telephone Encounter (Signed)
Patient returning call from [employer] stating that the dates he was out was March 18th to March 25th.  He has returned back to work as of March 26th.   He also wanted Dr. Nani Ravens to know he's been having low bp and fating while being seen in the [facility] ED. Was seen 3 weekends in a row for this.

## 2017-07-09 NOTE — Telephone Encounter (Signed)
LMOM [2nd] with contact name and number for return call RE: dates needed on paperwork before forwarding to provider and faxed to [place]/SLS 04/12

## 2017-07-09 NOTE — Telephone Encounter (Signed)
Ty for info. I would like to see him regarding this issue of fainting. TY.

## 2017-07-12 NOTE — Telephone Encounter (Signed)
Called left message to call back 

## 2017-07-12 NOTE — Telephone Encounter (Signed)
appt is scheduled  

## 2017-07-12 NOTE — Telephone Encounter (Signed)
Could you please call this patient and schedule him an appointment for Syncope per Dr. Nani Ravens, as soon as he can come in. Thanks so much/SLS

## 2017-07-15 ENCOUNTER — Inpatient Hospital Stay: Payer: BLUE CROSS/BLUE SHIELD | Admitting: Family Medicine

## 2017-07-15 DIAGNOSIS — Z0289 Encounter for other administrative examinations: Secondary | ICD-10-CM

## 2017-07-28 HISTORY — PX: OTHER SURGICAL HISTORY: SHX169

## 2017-08-20 ENCOUNTER — Telehealth: Payer: Self-pay | Admitting: Family Medicine

## 2017-08-20 NOTE — Telephone Encounter (Signed)
Copied from [place] [id number]. Topic: Quick Communication - See Telephone Encounter >> Aug 20, 2017  2:51 PM Cleaster Corin, NT wrote: CRM for notification. See Telephone encounter for: 08/20/17.  Pt. Calling to see if he can get testosterone injections he has previously had  patches pt would like for nurse to give him a call back at  [phone]

## 2017-08-24 NOTE — Telephone Encounter (Signed)
I don't see that we are even prescribing the topical? He will likely need an appt or he will need to f/u with specialist who was rx'ing this in the past? If his pcp was doing it, needs appt. TY.

## 2017-08-24 NOTE — Telephone Encounter (Signed)
Called patient and informed patient. Offer patient appointment, patient decline and stts he will call back. Patient adv he had knee surgery and cannot determine how he will be feeling. Adv patient to call us back when mobile.

## 2017-12-08 ENCOUNTER — Encounter: Payer: Self-pay | Admitting: Gastroenterology

## 2017-12-13 ENCOUNTER — Other Ambulatory Visit: Payer: Self-pay | Admitting: Orthopedic Surgery

## 2017-12-20 ENCOUNTER — Encounter: Payer: Self-pay | Admitting: Gastroenterology

## 2017-12-20 ENCOUNTER — Other Ambulatory Visit: Payer: BLUE CROSS/BLUE SHIELD

## 2017-12-20 ENCOUNTER — Ambulatory Visit (INDEPENDENT_AMBULATORY_CARE_PROVIDER_SITE_OTHER): Payer: BLUE CROSS/BLUE SHIELD | Admitting: Gastroenterology

## 2017-12-20 VITALS — BP 126/78 | HR 94 | Ht 73.5 in | Wt 241.2 lb

## 2017-12-20 DIAGNOSIS — Z1211 Encounter for screening for malignant neoplasm of colon: Secondary | ICD-10-CM | POA: Diagnosis not present

## 2017-12-20 DIAGNOSIS — Z1212 Encounter for screening for malignant neoplasm of rectum: Secondary | ICD-10-CM | POA: Diagnosis not present

## 2017-12-20 DIAGNOSIS — R718 Other abnormality of red blood cells: Secondary | ICD-10-CM | POA: Diagnosis not present

## 2017-12-20 MED ORDER — SOD PICOSULFATE-MAG OX-CIT ACD 10-3.5-12 MG-GM -GM/160ML PO SOLN: 1.0000 | ORAL | 0 refills | Status: DC

## 2017-12-20 NOTE — Patient Instructions (Addendum)
If you are age 49 or older, your body mass index should be between 23-30. Your Body mass index is 31.4 kg/m. If this is out of the aforementioned range listed, please consider follow up with your Primary Care Provider.  If you are age 41 or younger, your body mass index should be between 19-25. Your Body mass index is 31.4 kg/m. If this is out of the aformentioned range listed, please consider follow up with your Primary Care Provider.   You have been scheduled for a colonoscopy. Please follow written instructions given to you at your visit today.  Please pick up your prep supplies at the pharmacy within the next 1-3 days. If you use inhalers (even only as needed), please bring them with you on the day of your procedure. Your physician has requested that you go to www.startemmi.com and enter the access code given to you at your visit today. This web site gives a general overview about your procedure. However, you should still follow specific instructions given to you by our office regarding your preparation for the procedure.  We have sent the following medications to your pharmacy for you to pick up at your convenience: Clenpiq  Patient is requesting to have labs drawn by Hematologist on 12/22/2017. Please make sure these results are faxed back to [phone].  It was a pleasure to see you today!  Vito Cirigliano, D.O.

## 2017-12-20 NOTE — Progress Notes (Signed)
Chief Complaint: Colon cancer screening   Referring Provider:     Dr. Tana Coast   HPI:     Kerry Harrison is a 47 y.o. male with a history of Rheumatoid Arthritis referred to the [facility] for routine CRC screening.  No previous colonoscopy or CRC screening to date and he is otherwise without GI symptoms.  He denies hematochezia, melena, nausea, vomiting, fever, chills, early satiety, weight loss, night sweats.  No known family history of CRC, GI malignancy, liver disease, pancreatic disease, or IBD.   He has a history of fistulotomy in 2014 and incision and drainage of perirectal abscess in 2013. No recurrence of anorectal sxs since 2014. Was recommended for colonoscopy at that time, but did not have done.  For his RA, he follows with rheumatology at [facility], and recently prescribed Humira (not yet started) along with prednisone and leflunomide.  Most recent labs from 05/2017 notable for mild hyponatremia (134) and otherwise normal BMP and normal Hgb/Hct but with MCV/RDW 76.9/18.6. MCV was also low (74.9) in 2013 with low 80's in 2015-2018 and again low in Feb and March 2019. Elevated PLTs, which have been intermittently elevated since at least 2013 (was 473 then). He was referred to Hematology for further work-up of microcytosis and mild thrombocytosis by his rheumatologist with appt pending on 9/26. No previous iron panel.    Past Medical History:  Diagnosis Date  . Amenia   . Anorectal fistula   . Diverticulosis   . Hypotension   . Osteoarthritis of hips, bilateral   . Osteoarthritis of lumbar spine   . Rheumatoid arthritis [facility])      Past Surgical History:  Procedure Laterality Date  . EVALUATION UNDER ANESTHESIA WITH ANAL FISTULECTOMY N/A 06/30/2012   Procedure: EXAM UNDER ANESTHESIA  fistulotomy, possible ;  Surgeon: Leighton Ruff, MD;  Location: [facility];  Service: General;  Laterality: N/A;  . INCISION AND DRAINAGE  PERIRECTAL ABSCESS  09/16/2011   Procedure: IRRIGATION AND DEBRIDEMENT PERIRECTAL ABSCESS;  Surgeon: Madilyn Hook, DO;  Location: [facility];  Service: General;  Laterality: N/A;  . OTHER SURGICAL HISTORY Left 07/2017   bilateral knee surg; unsure what kind   Family History  Problem Relation Age of Onset  . Cancer Father        lung  . COPD Father   . Diabetes Paternal Grandmother   . Alcohol abuse Paternal Grandfather   . Arthritis Paternal Grandfather   . Cancer Paternal Grandfather        dont know   . Esophageal cancer Paternal Grandfather   . Colon cancer Neg Hx        dont know for sure   Social History   Tobacco Use  . Smoking status: Former Smoker    Years: 6.00    Types: Cigarettes    Last attempt to quit: 07/10/2011    Years since quitting: 6.4  . Smokeless tobacco: Never Used  Substance Use Topics  . Alcohol use: No    Frequency: Never  . Drug use: No   Current Outpatient Medications  Medication Sig Dispense Refill  . leflunomide (ARAVA) 10 MG tablet Take 1 tablet by mouth daily.    . predniSONE (DELTASONE) 10 MG tablet Take 1 tablet (10 mg total) by mouth daily with breakfast. Take 2 tabs for the first 7 days. 60 tablet 1  . Adalimumab 40 MG/0.4ML PSKT Inject into  the skin.     No current facility-administered medications for this visit.    Allergies  Allergen Reactions  . Meloxicam Diarrhea and Other (See Comments)    Dizziness  . Morphine And Related Other (See Comments)    LAST TIME GIVEN CAUSED HYPOTENSION     Review of Systems: All systems reviewed and negative except where noted in HPI.     Physical Exam:    Wt Readings from Last 3 Encounters:  12/20/17 241 lb 4 oz (109.4 kg)  06/21/17 263 lb (119.3 kg)  06/16/17 263 lb 6 oz (119.5 kg)    Ht 6' 1.5" (1.867 m)   Wt 241 lb 4 oz (109.4 kg)   BMI 31.40 kg/m  Constitutional:  Pleasant, in no acute distress. Psychiatric: Normal mood and affect. Behavior is normal. EENT: Pupils normal.   Conjunctivae are normal. No scleral icterus. Neck supple. No cervical LAD. Cardiovascular: Normal rate, regular rhythm. No edema Pulmonary/chest: Effort normal and breath sounds normal. No wheezing, rales or rhonchi. Abdominal: Soft, nondistended, nontender. Bowel sounds active throughout. There are no masses palpable. No hepatomegaly. Neurological: Alert and oriented to person place and time. Skin: Skin is warm and dry. No rashes noted.   ASSESSMENT AND PLAN;   [name] is a 47 y.o. male presenting to the Gastroenterology Clinic for:  1) Initial CRC screening: No family history of CRC or related malignancies, and patient is without any active GI sxs. No previous CRC screening to date. Discussed options for CRC screening, to include optical vs virtual colonoscopy - the risks and benefits and pros and cons of each, as well as discussion of FIT kit testing, Cologuard, etc, and the patient decided to proceed with an optical colonoscopy.   - Will schedule date and time for colonoscopy prior to leaving clinic today  - NPO at MN prior to procedure  - Bowel prep ordered with plan for instruction with GI clinical staff - All questions answered  The indications, risks, and benefits of colonoscopy were explained to the patient in detail. Risks include but are not limited to bleeding, perforation, adverse reaction to medications, and cardiopulmonary compromise. Sequelae include but are not limited to the possibility of surgery, hospitalization, and mortality. The patient verbalized understanding and wished to proceed. All questions answered, referred for scheduling and bowel prep ordered. Further recommendations pending results of the exam.    2) microcytosis: -Check iron panel for w/u of microcytosis with otherwise stable RDW and normal hemoglobin/hematocrit. - To f/u with Hematology later this week. Will defer additional serologic testing (i.e. thalassemia) to that eval  RTC PRN  Lavena Bullion, DO, FACG  12/20/2017, 9:53 AM   Nani Ravens, [place]*

## 2017-12-28 ENCOUNTER — Encounter (HOSPITAL_BASED_OUTPATIENT_CLINIC_OR_DEPARTMENT_OTHER): Payer: Self-pay

## 2017-12-29 NOTE — Pre-Procedure Instructions (Signed)
Notified Elmyra Ricks that Mr. Al wanted to cancel his procedure for 104/2019.

## 2017-12-31 ENCOUNTER — Encounter (HOSPITAL_BASED_OUTPATIENT_CLINIC_OR_DEPARTMENT_OTHER): Payer: Self-pay

## 2017-12-31 ENCOUNTER — Ambulatory Visit (HOSPITAL_BASED_OUTPATIENT_CLINIC_OR_DEPARTMENT_OTHER): Admit: 2017-12-31 | Payer: BLUE CROSS/BLUE SHIELD | Admitting: Orthopedic Surgery

## 2017-12-31 ENCOUNTER — Encounter: Payer: BLUE CROSS/BLUE SHIELD | Admitting: Gastroenterology

## 2017-12-31 HISTORY — DX: Rectal abscess: K61.1

## 2017-12-31 HISTORY — DX: Synovial cyst of popliteal space (Baker), right knee: M71.21

## 2017-12-31 HISTORY — DX: Spinal stenosis, site unspecified: M48.00

## 2017-12-31 HISTORY — DX: Low back pain: M54.5

## 2017-12-31 HISTORY — DX: Low back pain, unspecified: M54.50

## 2017-12-31 SURGERY — ARTHROSCOPY, KNEE
Anesthesia: General | Site: Knee | Laterality: Right

## 2018-01-07 ENCOUNTER — Encounter: Payer: Self-pay | Admitting: Gastroenterology

## 2018-01-21 ENCOUNTER — Encounter: Payer: Self-pay | Admitting: Gastroenterology

## 2018-01-21 ENCOUNTER — Ambulatory Visit (AMBULATORY_SURGERY_CENTER): Payer: BLUE CROSS/BLUE SHIELD | Admitting: Gastroenterology

## 2018-01-21 VITALS — BP 127/83 | HR 69 | Temp 97.3°F | Resp 10 | Ht 73.0 in | Wt 241.0 lb

## 2018-01-21 DIAGNOSIS — K5010 Crohn's disease of large intestine without complications: Secondary | ICD-10-CM | POA: Diagnosis not present

## 2018-01-21 DIAGNOSIS — K633 Ulcer of intestine: Secondary | ICD-10-CM

## 2018-01-21 DIAGNOSIS — Z1211 Encounter for screening for malignant neoplasm of colon: Secondary | ICD-10-CM

## 2018-01-21 DIAGNOSIS — K56699 Other intestinal obstruction unspecified as to partial versus complete obstruction: Secondary | ICD-10-CM

## 2018-01-21 DIAGNOSIS — K621 Rectal polyp: Secondary | ICD-10-CM

## 2018-01-21 DIAGNOSIS — K640 First degree hemorrhoids: Secondary | ICD-10-CM

## 2018-01-21 DIAGNOSIS — D128 Benign neoplasm of rectum: Secondary | ICD-10-CM

## 2018-01-21 MED ORDER — SODIUM CHLORIDE 0.9 % IV SOLN: 500.0000 mL | Freq: Once | INTRAVENOUS | Status: AC

## 2018-01-21 NOTE — Progress Notes (Signed)
Called to room to assist during endoscopic procedure.  Patient ID and intended procedure confirmed with present staff. Received instructions for my participation in the procedure from the performing physician.  

## 2018-01-21 NOTE — Patient Instructions (Signed)
YOU HAD AN ENDOSCOPIC PROCEDURE TODAY AT [facility]:   Refer to the procedure report that was given to you for any specific questions about what was found during the examination.  If the procedure report does not answer your questions, please call your gastroenterologist to clarify.  If you requested that your care partner not be given the details of your procedure findings, then the procedure report has been included in a sealed envelope for you to review at your convenience later.  YOU SHOULD EXPECT: Some feelings of bloating in the abdomen. Passage of more gas than usual.  Walking can help get rid of the air that was put into your GI tract during the procedure and reduce the bloating. If you had a lower endoscopy (such as a colonoscopy or flexible sigmoidoscopy) you may notice spotting of blood in your stool or on the toilet paper. If you underwent a bowel prep for your procedure, you may not have a normal bowel movement for a few days.  Please Note:  You might notice some irritation and congestion in your nose or some drainage.  This is from the oxygen used during your procedure.  There is no need for concern and it should clear up in a day or so.  SYMPTOMS TO REPORT IMMEDIATELY:   Following lower endoscopy (colonoscopy or flexible sigmoidoscopy):  Excessive amounts of blood in the stool  Significant tenderness or worsening of abdominal pains  Swelling of the abdomen that is new, acute  Fever of 100F or higher   Following upper endoscopy (EGD)  Vomiting of blood or coffee ground material  New chest pain or pain under the shoulder blades  Painful or persistently difficult swallowing  New shortness of breath  Fever of 100F or higher  Black, tarry-looking stools  For urgent or emergent issues, a gastroenterologist can be reached at any hour by calling [phone].   DIET:  We do recommend a small meal at first, but then you may proceed to your regular diet.  Drink  plenty of fluids but you should avoid alcoholic beverages for 24 hours.  ACTIVITY:  You should plan to take it easy for the rest of today and you should NOT DRIVE or use heavy machinery until tomorrow (because of the sedation medicines used during the test).    FOLLOW UP: Our staff will call the number listed on your records the next business day following your procedure to check on you and address any questions or concerns that you may have regarding the information given to you following your procedure. If we do not reach you, we will leave a message.  However, if you are feeling well and you are not experiencing any problems, there is no need to return our call.  We will assume that you have returned to your regular daily activities without incident.  If any biopsies were taken you will be contacted by phone or by letter within the next 1-3 weeks.  Please call us at [phone] if you have not heard about the biopsies in 3 weeks.    SIGNATURES/CONFIDENTIALITY: You and/or your care partner have signed paperwork which will be entered into your electronic medical record.  These signatures attest to the fact that that the information above on your After Visit Summary has been reviewed and is understood.  Full responsibility of the confidentiality of this discharge information lies with you and/or your care-partner.   Polyp and diverticulosis information given.  Full liquid diet together.  Return to GI clinic in one week to review results and ongoing medical treatment.

## 2018-01-21 NOTE — Op Note (Signed)
[facility] Patient Name: Kerry Harrison Procedure Date: 01/21/2018 3:07 PM MRN: [id number] Endoscopist: Gerrit Heck , MD Age: 47 Referring MD:  Date of Birth: 02-15-1971 Gender: Male Account #: [id number] Procedure:                Colonoscopy Indications:              Screening for colorectal malignant neoplasm,                            History of perianal abscess in 2013 requring                            incision and drainage and fistulotomy in 2014.                           47 yo male with history of RA, currently treated                            with prednisone and leflunomide with recommendation                            for Humira, presenting for initial colonoscopy. He                            has a history of perianal abscess in 2013 requring                            incision and drainage and fistulotomy in 2014. No                            current GI symptoms. Medicines:                Monitored Anesthesia Care Procedure:                Pre-Anesthesia Assessment:                           - Prior to the procedure, a History and Physical                            was performed, and patient medications and                            allergies were reviewed. The patient's tolerance of                            previous anesthesia was also reviewed. The risks                            and benefits of the procedure and the sedation                            options and risks were discussed with the patient.  All questions were answered, and informed consent                            was obtained. Prior Anticoagulants: The patient has                            taken no previous anticoagulant or antiplatelet                            agents. ASA Grade Assessment: II - A patient with                            mild systemic disease. After reviewing the risks                            and benefits, the patient was deemed in                            satisfactory condition to undergo the procedure.                           After obtaining informed consent, the colonoscope                            was passed under direct vision. Throughout the                            procedure, the patient's blood pressure, pulse, and                            oxygen saturations were monitored continuously. The                            Colonoscope was introduced through the anus and                            advanced to the the ileocecal valve. The                            colonoscopy was performed without difficulty. The                            patient tolerated the procedure well. The quality                            of the bowel preparation was adequate. Scope In: 3:10:14 PM Scope Out: 3:27:51 PM Scope Withdrawal Time: 0 hours 15 minutes 52 seconds  Total Procedure Duration: 0 hours 17 minutes 37 seconds  Findings:                 The perianal and digital rectal examinations were                            normal.  Inflammation characterized by altered vascularity,                            congestion (edema), erosions, erythema, and deep                            ulcerations was found as patches surrounded by                            normal mucosa in the recto-sigmoid colon, in the                            sigmoid colon, in the descending colon, in the                            transverse colon, in the ascending colon, at the                            cecum and in the terminal ileum. The rectum was                            spared. This was moderate in severity and the                            findings are new. Biopsies were taken throughout                            the colon with a cold forceps for histology.                            Estimated blood loss was minimal.                           An inflammatory moderate stenosis was found in the                             proximal ascending colon, immediately distal to the                            cecum, and was traversed. Biopsies were taken with                            a cold forceps for histology. Estimated blood loss                            was minimal.                           An inflammatory severe stenosis was found at the                            ileocecal valve and was non-traversed. Able to  briefly visualize the ileum. Biopsies were taken                            from the with a cold forceps for histology.                            Separate biopsies were taken from the ileocecal                            valave for histology. Estimated blood loss was                            minimal.                           Non-bleeding internal hemorrhoids were found during                            retroflexion. The hemorrhoids were small.                           A 2 mm polyp was found in the sigmoid colon. The                            polyp was sessile. The polyp was removed with a                            cold biopsy forceps. Resection and retrieval were                            complete. Estimated blood loss was minimal. Complications:            No immediate complications. Estimated Blood Loss:     Estimated blood loss was minimal. Estimated blood                            loss was minimal. Impression:               - Moderately severe inflammation was found in the                            recto-sigmoid colon, in the sigmoid colon, in the                            descending colon, in the transverse colon, in the                            ascending colon, at the cecum and in the terminal                            ileum. This was moderate in severity, new compared                            to previous examinations. Biopsied. The endoscopic  appearance is most consistent with stricturing-type                             (potentially fistulizing given history) Crohn's                            Disease.                           - Stricture in the proximal ascending colon.                            Biopsied.                           - Stricture at the ileocecal valve. Biopsied.                           - Non-bleeding internal hemorrhoids.                           - One 2 mm polyp in the sigmoid colon, removed with                            a cold biopsy forceps. Resected and retrieved. Recommendation:           - Patient has a contact number available for                            emergencies. The signs and symptoms of potential                            delayed complications were discussed with the                            patient. Return to normal activities tomorrow.                            Written discharge instructions were provided to the                            patient.                           - Full liquid diet today.                           - Continue present medications.                           - Await pathology results.                           - Return to GI clinic in 1 week to review results                            and ongoing  medical management. Gerrit Heck, MD 01/21/2018 3:42:46 PM

## 2018-01-21 NOTE — Progress Notes (Signed)
Report to PACU, RN, vss, BBS= Clear.  

## 2018-01-21 NOTE — Progress Notes (Signed)
Pt. To see Dr.Cirigliano at [facility] office next week.  Dr. Bryan Lemma will message Nira Conn to set up appointment and contact patient.

## 2018-01-24 ENCOUNTER — Telehealth: Payer: Self-pay

## 2018-01-24 NOTE — Telephone Encounter (Signed)
  Follow up Call-  Call back number 01/21/2018  Post procedure Call Back phone  # [phone] cell  Permission to leave phone message Yes  Some recent data might be hidden     Patient questions:  Do you have a fever, pain , or abdominal swelling? No. Pain Score  0 *  Have you tolerated food without any problems? Yes.    Have you been able to return to your normal activities? Yes.    Do you have any questions about your discharge instructions: Diet   No. Medications  No. Follow up visit  No.  Do you have questions or concerns about your Care? Yes.    Actions: * If pain score is 4 or above: No action needed, pain <4.  Pt reported he had a lot of gas to release.  I explained this was normal.  He reported he gas was gone and felt ok now.  Pt had questions about Crohn's disease.  I answered questions and I reminded pt he will have a follow up visit with Dr. Bryan Lemma in about 1 week to review his pathology results and ongoing medical management.  Pt was told the office will call him with this appointment. Also told pt to make a list of other questions and that Dr. Bryan Lemma will be glad to answer questions for pt.  Pt said he would. maw

## 2018-01-28 ENCOUNTER — Other Ambulatory Visit (INDEPENDENT_AMBULATORY_CARE_PROVIDER_SITE_OTHER): Payer: BLUE CROSS/BLUE SHIELD

## 2018-01-28 ENCOUNTER — Encounter: Payer: Self-pay | Admitting: Gastroenterology

## 2018-01-28 ENCOUNTER — Ambulatory Visit (INDEPENDENT_AMBULATORY_CARE_PROVIDER_SITE_OTHER): Payer: BLUE CROSS/BLUE SHIELD | Admitting: Gastroenterology

## 2018-01-28 VITALS — BP 128/80 | HR 93 | Ht 73.5 in | Wt 255.0 lb

## 2018-01-28 DIAGNOSIS — K50919 Crohn's disease, unspecified, with unspecified complications: Secondary | ICD-10-CM

## 2018-01-28 DIAGNOSIS — F17200 Nicotine dependence, unspecified, uncomplicated: Secondary | ICD-10-CM | POA: Diagnosis not present

## 2018-01-28 DIAGNOSIS — M069 Rheumatoid arthritis, unspecified: Secondary | ICD-10-CM

## 2018-01-28 LAB — SEDIMENTATION RATE: SED RATE: 50 mm/h — AB (ref 0–15)

## 2018-01-28 LAB — B12 AND FOLATE PANEL
FOLATE: 10.7 ng/mL (ref >5.9)
Vitamin B-12: 210 pg/mL — ABNORMAL LOW (ref 211–911)

## 2018-01-28 LAB — VITAMIN D 25 HYDROXY (VIT D DEFICIENCY, FRACTURES): VITD: 12.03 ng/mL — AB (ref 30.00–100.00)

## 2018-01-28 LAB — FERRITIN: FERRITIN: 1315.3 ng/mL — AB (ref 22.0–322.0)

## 2018-01-28 LAB — C-REACTIVE PROTEIN: CRP: 6.5 mg/dL (ref 0.5–20.0)

## 2018-01-28 NOTE — Progress Notes (Signed)
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  Initially seen by me on 12/20/2017 for routine colon cancer screening and was otherwise without GI symptoms.  He does have a long-standing history of RA, previously treated with methotrexate and more recently with prednisone and leflunomide.  Was started on Humira 1 week prior to diagnostic colonoscopy for his RA.He has a history of fistulotomy in 2014 and incision and drainage of perirectal abscess in 2013. No recurrence of anorectal sxs since 2014. Was recommended for colonoscopy at that time, but did not have done.  Additionally, he has a history of intermittent thrombocytosis and microcytic anemia, recently evaluated by hematology at [facility].  HPI:     Patient is a 47 y.o. male with newly diagnosed stricturing type (?fistulizing type) Ileocolonic Crohn's Disease, presenting to the GI clinic for follow-up.  He was initially seen by me on 12/20/2017 for routine CRC screening with colonoscopy completed on 01/21/2018 and notable for moderately severe colitis, severely stenosed IC valve, moderate stenosis of proximal ascending colon with biopsies notable for moderately active chronic colitis.  Today he states started with a cold yesterday- sinus congestion, runny nose. Wife had similar sxs. No fevers. Does states that he may have had intermittent GI sxs for years, described as abdominal pain, loose stools, n/v, with a few ER evaluations. Sxs typically lasted a few days then eventually abate. Did not mention this at initial appt. Had some of those sxs even with RA treatment. Has been treated for RA for >10 years.   Currently on Humira 40 mg every other week, prednisone 10 mg daily, leflunomide 10 mg daily as prescribed by  rheumatologist.   Endoscopic history: -Colonoscopy (01/21/2018): Moderately severe colitis with ulceration in the cecum through sigmoid colon with surrounding areas of normal-appearing mucosa, moderate stenosis in the proximal ascending colon that was traversed, severe stenosis of the terminal ileum but could not be traversed, rectal sparing.  Biopsies notable for chronic moderately active colitis.  Able to pass forceps through stenosed ICV and biopsies with normal-appearing ileal mucosa.  Biopsies of ICV with ulcer and inflammation.  IBD History:  Stricturing and likely fistulizing type ileocolonic Crohn's disease, diagnosed 12/2017 Evaluation to date:  - TPMT: None - TB testing: Negative - HBV status: Negative - Pertinent Imaging: None recently - Last colonoscopy: 12/2017 - Small bowel imaging: None recently - History of EIMs: Has a history of RA, otherwise no EIMs  Medications to date:  Has trialed multiple medications for RA to include methotrexate, leflunomide, prednisone and most recently started on Humira 1 week prior to index colonoscopy  Health Maintenance:  - DEXA: To coordinate in conjunction with rheumatologist - Vaccinations:      - Annual Flu Vaccine : To get next week      - Pneumococcal Vaccine if receiving immunosuppression: Has not received      - Zoster vaccine if over age 48: Holding due to recent start of anti-TNF  - Micronutrient eval:       - Annual Vit D, B6, iron panel: We will review recent hematology labs      - Ileal disease: B12, fat soluble vitamins: Ordering today - Surveillance colonoscopy: N/A - Surveillance labs for immunomodulators: N/A - Annual depression screening: None - Annual Dermatology/Skin exam: Will need to schedule  Review of systems:     No chest pain, no SOB, no fevers, no urinary sx   Past Medical History:  Diagnosis Date  . Amenia   . Anemia   . Anorectal fistula   . Baker's cyst, right 2014  . Diverticulosis   .  Hypotension   . Low back pain   . Monoclonal gammopathy   . Osteoarthritis of hips, bilateral   . Osteoarthritis of lumbar spine   . Perirectal abscess 2013  . Rheumatoid arthritis ([place])   . Spinal stenosis     Patient's surgical history, family medical history, social history, medications and allergies were all reviewed in Epic    Current Outpatient Medications  Medication Sig Dispense Refill  . Adalimumab 40 MG/0.4ML PSKT Inject into the skin.    Marland Kitchen leflunomide (ARAVA) 10 MG tablet Take 1 tablet by mouth daily.    . predniSONE (DELTASONE) 10 MG tablet Take 1 tablet (10 mg total) by mouth daily with breakfast. Take 2 tabs for the first 7 days. 60 tablet 1   Current Facility-Administered Medications  Medication Dose Route Frequency Provider Last Rate Last Dose  . 0.9 %  sodium chloride infusion  500 mL Intravenous Once Donnovan Stamour V, DO        Physical Exam:     Ht 6' 1.5" (1.867 m)   Wt 255 lb (115.7 kg)   BMI 33.19 kg/m   GENERAL:  Pleasant male in NAD PSYCH: : Cooperative, normal affect EENT:  conjunctiva pink, mucous membranes moist, neck supple without masses CARDIAC:  RRR, no murmur heard, no peripheral edema PULM: Normal respiratory effort, lungs CTA bilaterally, no wheezing ABDOMEN:  Nondistended, soft, nontender. No obvious masses, no hepatomegaly,  normal bowel sounds SKIN:  turgor, no lesions seen Musculoskeletal:  Normal muscle tone, normal strength NEURO: Alert and oriented x 3, no focal neurologic deficits   IMPRESSION and PLAN:    #1.  Stricturing (and likely fistulizing) type ileocolonic Crohn's disease: While he previously said he was without GI symptoms on initial encounter, he now states that he has had years of intermittent mild GI symptoms which could be related to Crohn's disease.  Reasonable to think that he has had Crohn's for years that has been kept mostly at [place] with his immunosuppression for his RA.  Discussed pathophysiology of Crohn's  disease at length.  Hopeful that the severe stricture of his IC valve is inflammatory and can respond to his recently started Humira, but did discuss the possible need for surgical intervention in the future.  - ESR/CRP now to use as comparison moving forward - Vit B6, B12, folate, Vit D - Repeat Colo in 6 months to assess for mucosal healing.  May consider balloon dilation if persistent IC valve stricture - Flu vaccine when feeling better  - Annual derm eval - Discussed the utility of CT enterography.  He would like to hold off on this and monitor clinically for response to Humira.  If stricture still present at repeat colonoscopy, or if change in clinical symptoms, will proceed with CTE at that time to look for additional small bowel Crohn's and evaluate for fixed versus inflammatory stricture -Similarly, discussed the indications for EGD to evaluate for upper GI Crohn's.  As he is without UGI symptoms and this does not change overall medical management, he elected to defer. - Continue Humira as prescribed.  Additionally he is on prednisone for his RA.  Discussed the indications for "top-down" therapy.  He would be a  candidate for dual agent immunosuppression, but given his added immunosuppression with RA, feel it is unwise at this time to add something like an immunomodulator.  He has previously used methotrexate.   -quit smoking - F/u in 3 months or sooner prn     #2.  Tobacco use disorder: Strongly recommended that he quit smoking not only for overall clinical benefit but for proven benefit in aggressive phenotypic Crohn's disease.  #3.  Rheumatoid arthritis: Follows closely with his rheumatologist as above.   I spent a total of 25 minutes of face-to-face time with the patient. Greater than 50% of the time was spent counseling and coordinating care.   Cc:      Tana Coast, MD  [place] Margaretann Loveless, MD   Lavena Bullion ,DO, [facility] 01/28/2018, 8:28 AM

## 2018-01-28 NOTE — Patient Instructions (Addendum)
If you are age 49 or older, your body mass index should be between 23-30. Your Body mass index is 33.19 kg/m. If this is out of the aforementioned range listed, please consider follow up with your Primary Care Provider.  If you are age 5 or younger, your body mass index should be between 19-25. Your Body mass index is 33.19 kg/m. If this is out of the aformentioned range listed, please consider follow up with your Primary Care Provider.   Please go to the lab on the 2nd floor [address] before you leave the office today.   You have been scheduled for an endoscopy. Please follow written instructions given to you at your visit today. If you use inhalers (even only as needed), please bring them with you on the day of your procedure. Your physician has requested that you go to www.startemmi.com and enter the access code given to you at your visit today. This web site gives a general overview about your procedure. However, you should still follow specific instructions given to you by our office regarding your preparation for the procedure.  It was a pleasure to see you today!  Vito Cirigliano, D.O.

## 2018-01-30 LAB — VITAMIN B6: VITAMIN B6: 2.1 ng/mL (ref 2.1–21.7)

## 2018-01-31 ENCOUNTER — Encounter: Payer: BLUE CROSS/BLUE SHIELD | Admitting: Gastroenterology

## 2018-02-02 ENCOUNTER — Other Ambulatory Visit: Payer: Self-pay

## 2018-02-02 DIAGNOSIS — E538 Deficiency of other specified B group vitamins: Secondary | ICD-10-CM

## 2018-02-02 DIAGNOSIS — K50919 Crohn's disease, unspecified, with unspecified complications: Secondary | ICD-10-CM

## 2018-02-02 DIAGNOSIS — E559 Vitamin D deficiency, unspecified: Secondary | ICD-10-CM

## 2018-02-02 MED ORDER — VITAMIN D (ERGOCALCIFEROL) 1.25 MG (50000 UNIT) PO CAPS: 50000.0000 [IU] | ORAL_CAPSULE | ORAL | 0 refills | Status: DC

## 2018-02-09 ENCOUNTER — Encounter: Payer: Self-pay | Admitting: Gastroenterology

## 2018-02-09 ENCOUNTER — Ambulatory Visit (AMBULATORY_SURGERY_CENTER): Payer: BLUE CROSS/BLUE SHIELD | Admitting: Gastroenterology

## 2018-02-09 VITALS — BP 129/95 | HR 65 | Temp 97.7°F | Resp 12 | Ht 73.5 in | Wt 255.0 lb

## 2018-02-09 DIAGNOSIS — K50919 Crohn's disease, unspecified, with unspecified complications: Secondary | ICD-10-CM

## 2018-02-09 DIAGNOSIS — K2980 Duodenitis without bleeding: Secondary | ICD-10-CM | POA: Diagnosis not present

## 2018-02-09 DIAGNOSIS — K2970 Gastritis, unspecified, without bleeding: Secondary | ICD-10-CM

## 2018-02-09 DIAGNOSIS — E559 Vitamin D deficiency, unspecified: Secondary | ICD-10-CM

## 2018-02-09 DIAGNOSIS — K2990 Gastroduodenitis, unspecified, without bleeding: Secondary | ICD-10-CM

## 2018-02-09 MED ORDER — SODIUM CHLORIDE 0.9 % IV SOLN: 500.0000 mL | Freq: Once | INTRAVENOUS | Status: AC

## 2018-02-09 NOTE — Op Note (Signed)
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 Recent labs notable for                            Vitamin D deficiency. EGD to evaluate for upper GI                            Crohns Disease, villous atrophy, concommitant                            Celiac. Medicines:                Monitored Anesthesia Care Procedure:                Pre-Anesthesia Assessment:                           - Prior to the procedure, a History and Physical                            was performed, and patient medications and                            allergies were reviewed. The patient's tolerance of                            previous anesthesia was also reviewed. The risks                            and benefits of the procedure and the sedation                            options and risks were discussed with the patient.                            All questions were answered, and informed consent  was obtained. Prior Anticoagulants: The patient has                            taken no previous anticoagulant or antiplatelet                            agents. ASA Grade Assessment: II - A patient with                            mild systemic disease. After reviewing the risks                            and benefits, the patient was deemed in                            satisfactory condition to undergo the procedure.           After obtaining informed consent, the endoscope was                            passed under direct vision. Throughout the                            procedure, the patient's blood pressure, pulse, and                            oxygen saturations were monitored continuously. The                            Model GIF-HQ190 [id number]) scope was introduced                            through the mouth, and advanced to the second part                            of duodenum. The upper GI endoscopy was                            accomplished without difficulty. The patient                            tolerated the procedure well. Scope In: Scope Out: Findings:                 The examined esophagus was normal.                           Diffuse mild inflammation characterized by                            congestion (edema) was found in the gastric fundus                            and in the gastric body. Biopsies were taken with a  cold forceps for histology. Estimated blood loss                            was minimal.                           The cardia, gastric antrum and pylorus were normal.                           The duodenal bulb, first portion of the duodenum                            and second portion of the duodenum were normal.                            Biopsies were taken with a cold forceps for                            histology. Complications:            No immediate complications. Estimated Blood Loss:     Estimated blood loss was minimal. Impression:               - Normal esophagus.                           - Gastritis. Biopsied.                           - Normal cardia, antrum and pylorus.                           - Normal duodenal bulb, first portion of the                            duodenum and second portion of the duodenum.                            Biopsied. Recommendation:           - Patient has a contact number available  for                            emergencies. The signs and symptoms of potential                            delayed complications were discussed with the                            patient. Return to normal activities tomorrow.                            Written discharge instructions were provided to the                            patient.                           -  Resume previous diet today.                           - Continue present medications.                           - Await pathology results.                           - Return to GI clinic at appointment to be                            scheduled. Gerrit Heck, MD 02/09/2018 9:55:24 AM

## 2018-02-09 NOTE — Patient Instructions (Signed)
YOU HAD AN ENDOSCOPIC PROCEDURE TODAY AT [facility]:   Refer to the procedure report that was given to you for any specific questions about what was found during the examination.  If the procedure report does not answer your questions, please call your gastroenterologist to clarify.  If you requested that your care partner not be given the details of your procedure findings, then the procedure report has been included in a sealed envelope for you to review at your convenience later.  YOU SHOULD EXPECT: Some feelings of bloating in the abdomen. Passage of more gas than usual.  Walking can help get rid of the air that was put into your GI tract during the procedure and reduce the bloating. If you had a lower endoscopy (such as a colonoscopy or flexible sigmoidoscopy) you may notice spotting of blood in your stool or on the toilet paper. If you underwent a bowel prep for your procedure, you may not have a normal bowel movement for a few days.  Please Note:  You might notice some irritation and congestion in your nose or some drainage.  This is from the oxygen used during your procedure.  There is no need for concern and it should clear up in a day or so.  SYMPTOMS TO REPORT IMMEDIATELY:   Following upper endoscopy (EGD)  Vomiting of blood or coffee ground material  New chest pain or pain under the shoulder blades  Painful or persistently difficult swallowing  New shortness of breath  Fever of 100F or higher  Black, tarry-looking stools  For urgent or emergent issues, a gastroenterologist can be reached at any hour by calling [phone].   DIET:  We do recommend a small meal at first, but then you may proceed to your regular diet.  Drink plenty of fluids but you should avoid alcoholic beverages for 24 hours.  ACTIVITY:  You should plan to take it easy for the rest of today and you should NOT DRIVE or use heavy machinery until tomorrow (because of the sedation medicines used  during the test).    FOLLOW UP: Our staff will call the number listed on your records the next business day following your procedure to check on you and address any questions or concerns that you may have regarding the information given to you following your procedure. If we do not reach you, we will leave a message.  However, if you are feeling well and you are not experiencing any problems, there is no need to return our call.  We will assume that you have returned to your regular daily activities without incident.  If any biopsies were taken you will be contacted by phone or by letter within the next 1-3 weeks.  Please call us at [phone] if you have not heard about the biopsies in 3 weeks.   Await for biopsy results Office to call and schedule you a follow up appointment  SIGNATURES/CONFIDENTIALITY: You and/or your care partner have signed paperwork which will be entered into your electronic medical record.  These signatures attest to the fact that that the information above on your After Visit Summary has been reviewed and is understood.  Full responsibility of the confidentiality of this discharge information lies with you and/or your care-partner.

## 2018-02-09 NOTE — Progress Notes (Signed)
Report given to PACU, vss 

## 2018-02-09 NOTE — Progress Notes (Signed)
Called to room to assist during endoscopic procedure.  Patient ID and intended procedure confirmed with present staff. Received instructions for my participation in the procedure from the performing physician.  

## 2018-02-10 ENCOUNTER — Telehealth: Payer: Self-pay

## 2018-02-10 NOTE — Telephone Encounter (Signed)
  Follow up Call-  Call back number 02/09/2018 01/21/2018  Post procedure Call Back phone  # [phone] cell  Permission to leave phone message Yes Yes  Some recent data might be hidden     Patient questions:  Do you have a fever, pain , or abdominal swelling? No. Pain Score  0 *  Have you tolerated food without any problems? Yes.    Have you been able to return to your normal activities? Yes.    Do you have any questions about your discharge instructions: Diet   No. Medications  No. Follow up visit  No.  Do you have questions or concerns about your Care? No.  Actions: * If pain score is 4 or above: No action needed, pain <4.

## 2018-02-10 NOTE — Telephone Encounter (Signed)
Left message for patient to call back-concerning a follow up appt made for 12/17/119 at 3:00 pm, with arrival at 2:45 pm;

## 2018-02-10 NOTE — Telephone Encounter (Signed)
Letter sent to patient containing appointment information;

## 2018-02-15 ENCOUNTER — Encounter: Payer: Self-pay | Admitting: Gastroenterology

## 2018-03-15 ENCOUNTER — Ambulatory Visit: Payer: BLUE CROSS/BLUE SHIELD | Admitting: Gastroenterology

## 2018-04-05 ENCOUNTER — Ambulatory Visit: Payer: BLUE CROSS/BLUE SHIELD | Admitting: Gastroenterology

## 2018-04-05 ENCOUNTER — Encounter: Payer: Self-pay | Admitting: Gastroenterology

## 2018-04-05 VITALS — BP 128/76 | HR 105 | Ht 73.5 in | Wt 276.4 lb

## 2018-04-05 DIAGNOSIS — F17200 Nicotine dependence, unspecified, uncomplicated: Secondary | ICD-10-CM

## 2018-04-05 DIAGNOSIS — M069 Rheumatoid arthritis, unspecified: Secondary | ICD-10-CM | POA: Diagnosis not present

## 2018-04-05 DIAGNOSIS — K50919 Crohn's disease, unspecified, with unspecified complications: Secondary | ICD-10-CM | POA: Diagnosis not present

## 2018-04-05 NOTE — Patient Instructions (Addendum)
If you are age 50 or older, your body mass index should be between 23-30. Your Body mass index is 35.97 kg/m. If this is out of the aforementioned range listed, please consider follow up with your Primary Care Provider.  If you are age 27 or younger, your body mass index should be between 19-25. Your Body mass index is 35.97 kg/m. If this is out of the aformentioned range listed, please consider follow up with your Primary Care Provider.   We have sent a referral to [facility]. You will be receiving a call to schedule an appointment with them soon.  It was a pleasure to see you today!  Vito Cirigliano, D.O.

## 2018-04-05 NOTE — Progress Notes (Signed)
P  Chief Complaint:    Newly diagnosed Ileocolonic Crohn's Disease  GI History: 48 year old male with stricturing-type (?fistulizing type as well given history in 2013-2014) ileocolonic Crohn's disease diagnosed 12/2017 at time of colonoscopy for routine CRC screening.  Initially seen by me on 12/20/2017 for routine colon cancer screening and was otherwise without GI symptoms (although at subsequent appointments endorsed intermittent abdominal pain, loose stools, nausea/vomiting with a few ER evaluations over the years).  He does have a long-standing history of RA, previously treated with methotrexate and more recently with prednisone and leflunomide.  Has been treated for RA for > 10 years. Was started on Humira 1 week prior to diagnostic colonoscopy for his RA. He has a history of fistulotomy in 2014 and incision and drainage of perirectal abscess in 2013.No recurrence of anorectal sxs since 2014. Was recommended for colonoscopy at that time, but did not have done. EGD essentially normal in 01/2018.   Additionally, he has a history of intermittent thrombocytosis and microcytic anemia, recently evaluated by hematology at [facility].  HPI:    Patient is a 48 y.o. male with newly diagnosed stricturing type (?fistulizing type) Ileocolonic Crohn's Disease, presenting to the GI clinic for follow-up.  He was initially seen by me on 12/20/2017 for routine CRC screening with colonoscopy completed on 01/21/2018 and notable for moderately severe colitis, severely stenosed IC valve, moderate stenosis of proximal ascending colon with biopsies notable for moderately active chronic colitis.  He was last seen by me on 01/28/2018.  At that time endorsed URI symptoms but without fever.  Plan at the last appointment for repeat colonoscopy in May 2020 to assess for ileal healing, flu vaccine when URI symptoms resolve, referral for annual dermatology evaluation.  Patient decided to hold off on CT enterography.  EGD  completed on 02/09/2018 and notable for non-H pylori gastritis, otherwise normal esophagus and duodenum (duodenal biopsies negative for Crohn's disease or celiac disease).    Recent labs notable for vitamin B6 in the low end of normal (plan to survey and repeat in 6 months): Vitamin D was 12 (started ergocalciferol), vitamin B12 mildly insufficient at 210 (started vitamin B12 1000 mcg daily), normal folate, normal CRP but elevated ESR at 30, elevated ferritin likely acute phase reactant.  No recent new imaging for review.  Currently on Humira 40 mg every other week, prednisone 15 mg daily, leflunomide 10 mg daily as prescribed by rheumatologist.  Today, he states appetite is back to baseline and tolerating all PO intake well. Energy has improved. Hoping to bring Prednisone down to 10 mg daily with Rheum at f/u appt.   Endoscopic history: -Colonoscopy (01/21/2018): Moderately severe colitis with ulceration in the cecum through sigmoid colon with surrounding areas of normal-appearing mucosa, moderate stenosis in the proximal ascending colon that was traversed, severe stenosis of the terminal ileum but could not be traversed, rectal sparing.  Biopsies notable for chronic moderately active colitis.  Able to pass forceps through stenosed ICV and biopsies with normal-appearing ileal mucosa.  Biopsies of ICV with ulcer and inflammation. -EGD (02/09/2018, Dr. Bryan Lemma): non-H pylori gastritis, otherwise normal esophagus and duodenum (duodenal biopsies with peptic duodenitis but negative for Crohn's disease or celiac disease).  IBD History:  Stricturing and likely fistulizing type ileocolonic Crohn's disease, diagnosed 12/2017 Evaluation to date:  - TPMT: None - TB testing: Negative - HBV status: Negative - Pertinent Imaging: None recently - Last colonoscopy: 12/2017 - Small bowel imaging: None recently - History of EIMs: Has  a history of RA, otherwise no EIMs  Medications to date:  Has trialed  multiple medications for RA to include methotrexate, leflunomide, prednisone and most recently started on Humira 1 week prior to index colonoscopy  Health Maintenance:  - DEXA: To coordinate in conjunction with rheumatologist - Vaccinations:      - Annual Flu Vaccine : Ordered today      - Pneumococcal Vaccine if receiving immunosuppression: Ordered today      - Zoster vaccine if over age 74: Holding due to anti-TNF  - Micronutrient eval:       - Annual Vit D, B6, iron panel: Completed 02/2018      - Ileal disease: B12, fat soluble vitamins: Completed 02/2018 - Surveillance colonoscopy: N/A - Surveillance labs for immunomodulators: N/A - Annual depression screening: None - Annual Dermatology/Skin exam: Ordered today  Review of systems:     No chest pain, no SOB, no fevers, no urinary sx   Past Medical History:  Diagnosis Date  . Amenia   . Anemia   . Anorectal fistula   . Baker's cyst, right 2014  . Diverticulosis   . Hypotension   . Low back pain   . Monoclonal gammopathy   . Osteoarthritis of hips, bilateral   . Osteoarthritis of lumbar spine   . Perirectal abscess 2013  . Rheumatoid arthritis ([place])   . Spinal stenosis     Patient's surgical history, family medical history, social history, medications and allergies were all reviewed in Epic    Current Outpatient Medications  Medication Sig Dispense Refill  . Adalimumab 40 MG/0.4ML PSKT Inject into the skin.    Marland Kitchen leflunomide (ARAVA) 10 MG tablet Take 1 tablet by mouth daily.    . predniSONE (DELTASONE) 10 MG tablet Take 1 tablet (10 mg total) by mouth daily with breakfast. Take 2 tabs for the first 7 days. 60 tablet 1  . Vitamin D, Ergocalciferol, (DRISDOL) 1.25 MG (50000 UT) CAPS capsule Take 1 capsule (50,000 Units total) by mouth every 7 (seven) days. 8 capsule 0   Current Facility-Administered Medications  Medication Dose Route Frequency Provider Last Rate Last Dose  . 0.9 %  sodium chloride infusion  500 mL  Intravenous Once ,  V, DO      . 0.9 %  sodium chloride infusion  500 mL Intravenous Once ,  V, DO        Physical Exam:     There were no vitals taken for this visit.  GENERAL:  Pleasant male in NAD PSYCH: : Cooperative, normal affect EENT:  conjunctiva pink, mucous membranes moist, neck supple without masses CARDIAC:  RRR, No murmur heard, no peripheral edema PULM: Normal respiratory effort, lungs CTA bilaterally, no wheezing ABDOMEN:  Nondistended, soft, nontender. No obvious masses, no hepatomegaly,  normal bowel sounds SKIN:  turgor, no lesions seen Musculoskeletal:  Normal muscle tone, normal strength NEURO: Alert and oriented x 3, no focal neurologic deficits   IMPRESSION and PLAN:    #1. Stricturing (and likely fistulizing) type ileocolonic Crohn's disease: Reasonable to think that he has had Crohn's for years that has been kept mostly at [place] with his immunosuppression for his RA.  Discussed pathophysiology of Crohn's disease at length.  Hopeful that the severe stricture of his IC valve is inflammatory and can respond to his recently started Humira, but did discuss the possible need for surgical intervention in the future.  - UTD on micronutrient eval - Repeat Colo in May 2020 to assess for  mucosal healing.  May consider balloon dilation if persistent IC valve stricture - Flu vaccine and pneumo vaccine ordered today - Annual derm eval- referral placed today - Previously discussed the utility of CT enterography.  He would like to hold off on this and monitor clinically for response to Humira.  If stricture still present at repeat colonoscopy, or if change in clinical symptoms, will proceed with CTE at that time to look for additional small bowel Crohn's and evaluate for fixed versus inflammatory stricture - Continue Humira as prescribed.  Additionally he is on prednisone for his RA.  Discussed the indications for "top-down" therapy.  He would be a  candidate for dual agent immunosuppression, but given his added immunosuppression with RA, feel it is unwise at this time to add something like an immunomodulator.  He has previously used methotrexate. -quit smoking - F/u in 3-6 months or sooner prn     #2.  Tobacco use disorder: Will continue to strongly recommend that he quit smoking not only for overall clinical benefit but for proven benefit in aggressive phenotypic Crohn's disease.  #3.  Rheumatoid arthritis: Follows closely with his rheumatologist as above. - Titrate Prednisone per Rheum recs in 03/09/18 note    I spent a total of 25 minutes of face-to-face time with the patient. Greater than 50% of the time was spent counseling and coordinating care.   Cc:      Tana Coast, MD             [place] Margaretann Loveless, MD      Lavena Bullion ,DO, [facility] 04/05/2018, 3:39 PM

## 2018-05-05 ENCOUNTER — Telehealth: Payer: Self-pay

## 2018-05-05 NOTE — Telephone Encounter (Signed)
Left message for patient to call back concerning completing  MD requested lab work; patient needs vitamin B12 and D to be scheduled at [place] office;

## 2018-05-05 NOTE — Telephone Encounter (Signed)
-----   Message from Marlon Pel, RN sent at 02/02/2018 11:05 AM EST ----- Patient will need labs- see results 02/02/18 Cirigliano Orders are in Will need scheduled at [facility] office

## 2018-05-10 NOTE — Telephone Encounter (Signed)
Left message for patient to call back  

## 2018-05-11 NOTE — Telephone Encounter (Signed)
Called and spoke with patient-patient informed of need for lab work as requested by MD; patient agreed with plan of care and scheduled appt for lab work at [place] on 05/12/2018 arrival at 9:00am for a 9:15 appt;  Patient verbalized understanding of information/instructions; Patient was advised to call back if questions/concerns arise;

## 2018-05-12 ENCOUNTER — Other Ambulatory Visit (INDEPENDENT_AMBULATORY_CARE_PROVIDER_SITE_OTHER): Payer: BLUE CROSS/BLUE SHIELD

## 2018-05-12 DIAGNOSIS — Z1211 Encounter for screening for malignant neoplasm of colon: Secondary | ICD-10-CM

## 2018-05-12 DIAGNOSIS — E538 Deficiency of other specified B group vitamins: Secondary | ICD-10-CM

## 2018-05-12 DIAGNOSIS — E559 Vitamin D deficiency, unspecified: Secondary | ICD-10-CM | POA: Diagnosis not present

## 2018-05-12 DIAGNOSIS — K50919 Crohn's disease, unspecified, with unspecified complications: Secondary | ICD-10-CM

## 2018-05-12 DIAGNOSIS — Z1212 Encounter for screening for malignant neoplasm of rectum: Secondary | ICD-10-CM

## 2018-05-12 DIAGNOSIS — R718 Other abnormality of red blood cells: Secondary | ICD-10-CM

## 2018-05-12 LAB — VITAMIN D 25 HYDROXY (VIT D DEFICIENCY, FRACTURES): VITD: 15.16 ng/mL — AB (ref 30.00–100.00)

## 2018-05-12 LAB — VITAMIN B12: Vitamin B-12: 180 pg/mL — ABNORMAL LOW (ref 211–911)

## 2018-05-13 LAB — IRON,TIBC AND FERRITIN PANEL
%SAT: 38 % (calc) (ref 20–48)
Ferritin: 1371 ng/mL — ABNORMAL HIGH (ref 38–380)
Iron: 97 ug/dL (ref 50–180)
TIBC: 257 ug/dL (ref 250–425)

## 2018-05-27 ENCOUNTER — Other Ambulatory Visit: Payer: Self-pay

## 2018-05-27 DIAGNOSIS — E538 Deficiency of other specified B group vitamins: Secondary | ICD-10-CM

## 2018-05-27 DIAGNOSIS — E559 Vitamin D deficiency, unspecified: Secondary | ICD-10-CM

## 2018-05-27 MED ORDER — VITAMIN D3 20 MCG (800 UNIT) PO TABS: 800.0000 [IU] | ORAL_TABLET | Freq: Every day | ORAL | 3 refills | Status: DC

## 2018-05-27 MED ORDER — ERGOCALCIFEROL 1.25 MG (50000 UT) PO CAPS: 50000.0000 [IU] | ORAL_CAPSULE | ORAL | 0 refills | Status: DC

## 2018-05-27 MED ORDER — VITAMIN B-12 1000 MCG PO TABS: 1000.0000 ug | ORAL_TABLET | Freq: Every day | ORAL | 3 refills | Status: DC

## 2018-07-05 ENCOUNTER — Telehealth: Payer: Self-pay | Admitting: *Deleted

## 2018-07-05 NOTE — Telephone Encounter (Signed)
Vitamin d refill request faxed back to pharmacy

## 2018-08-25 ENCOUNTER — Other Ambulatory Visit: Payer: BLUE CROSS/BLUE SHIELD

## 2019-06-09 IMAGING — CT CT ANGIO CHEST
2 of 10 series · 18 of 36 positions shown · IV contrast (iopamidol)
Comparison: None.

CLINICAL DATA: Cough, sore throat, diarrhea, burning for 2 days

EXAM:
CT ANGIOGRAPHY CHEST WITH CONTRAST
TECHNIQUE: Multidetector CT imaging of the chest was performed using the
standard protocol during bolus administration of intravenous
contrast. Multiplanar CT image reconstructions and MIPs were
obtained to evaluate the vascular anatomy.
CONTRAST:  100mL E0XI6D-5ZC IOPAMIDOL ([id number]) INJECTION 76%

[Series 8: pe thins · axial · 0.76mm/px · z∈[-142,+110]mm · 17 of 284 slices shown]
[im 16/284  lung]
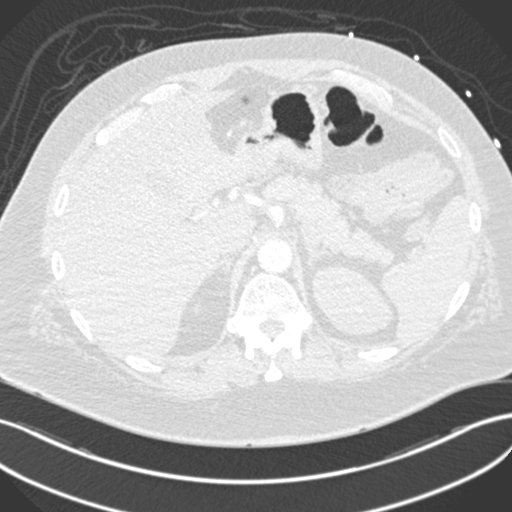
[im 32/284  mediastinal]
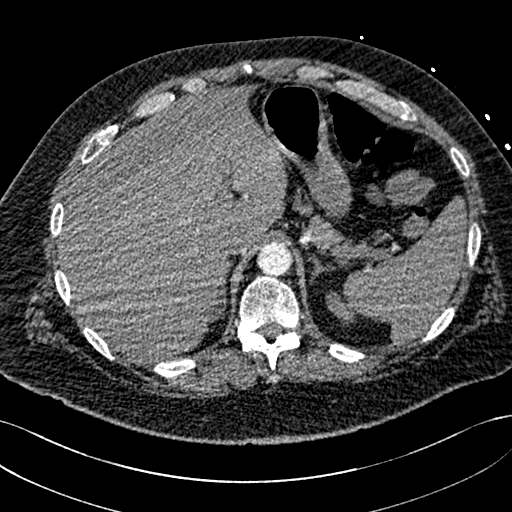
[im 48/284  lung]
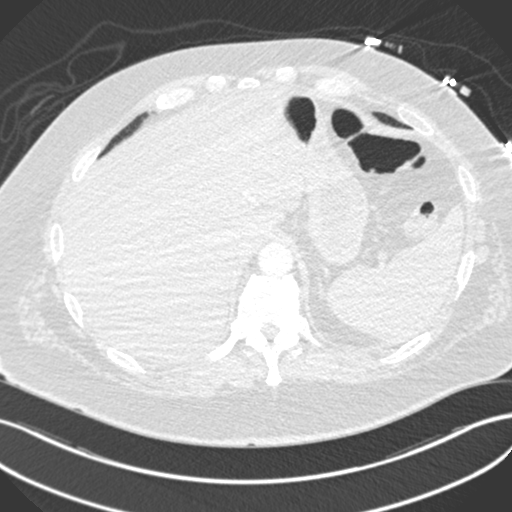
[im 63/284  mediastinal]
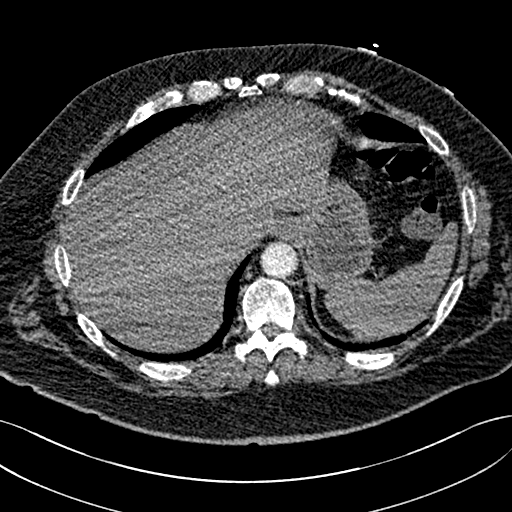
[im 79/284  lung]
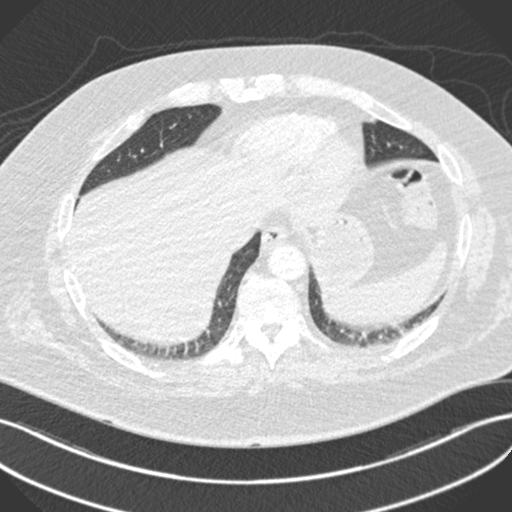
[im 95/284  mediastinal]
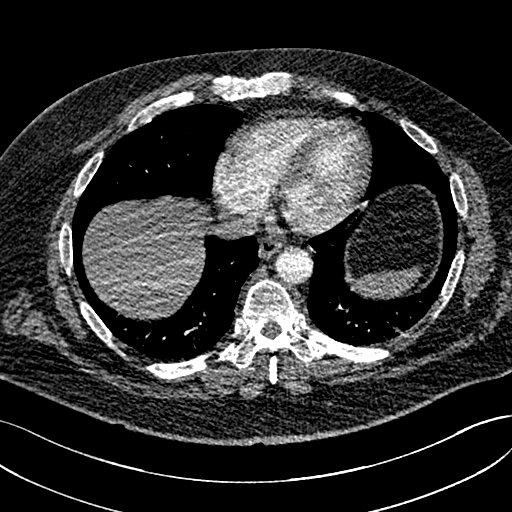
[im 111/284  lung]
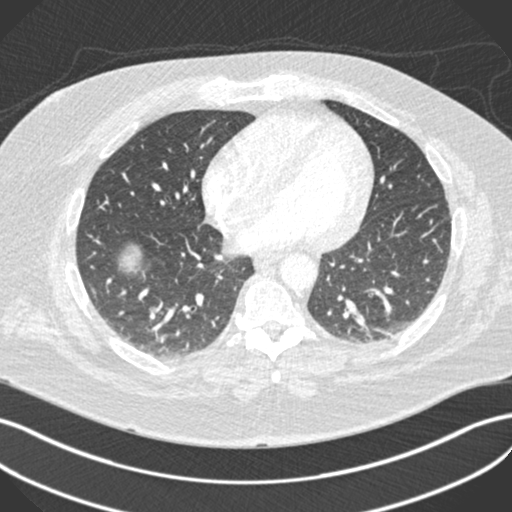
[im 126/284  mediastinal]
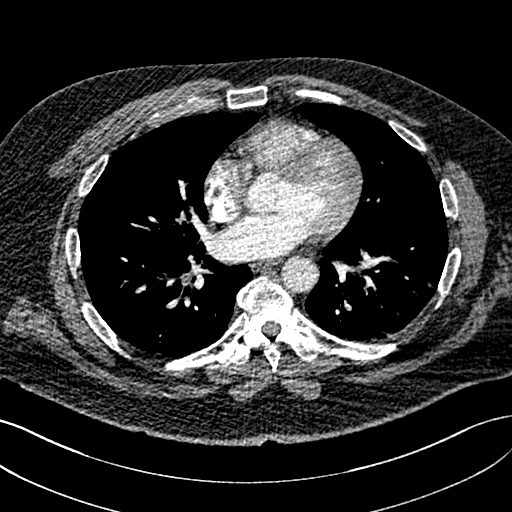
[im 142/284  lung]
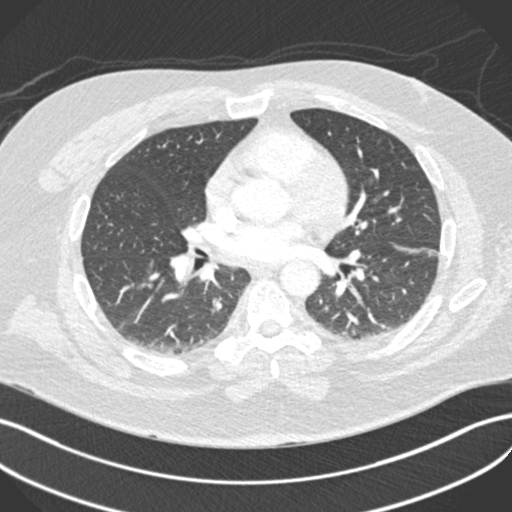
[im 158/284  mediastinal]
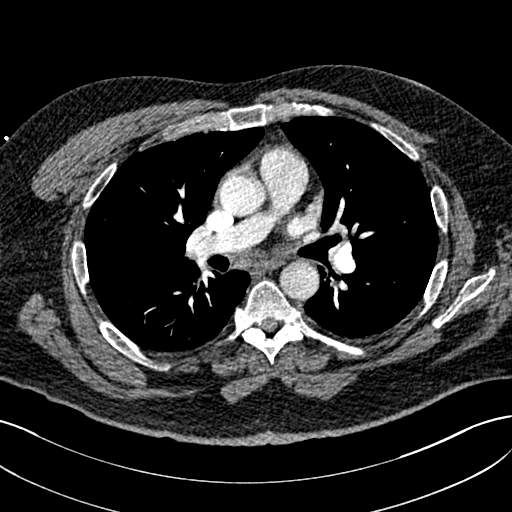
[im 173/284  lung]
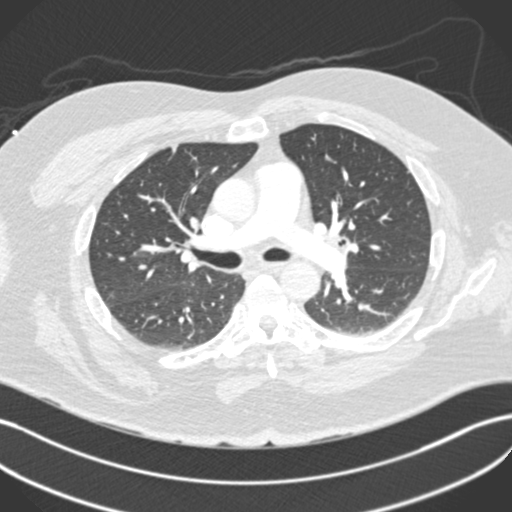
[im 189/284  mediastinal]
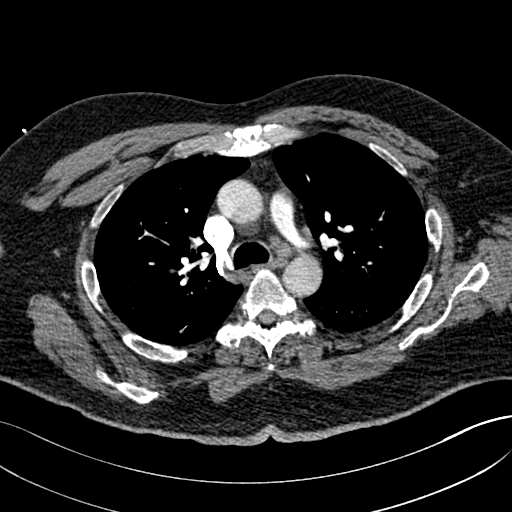
[im 205/284  lung]
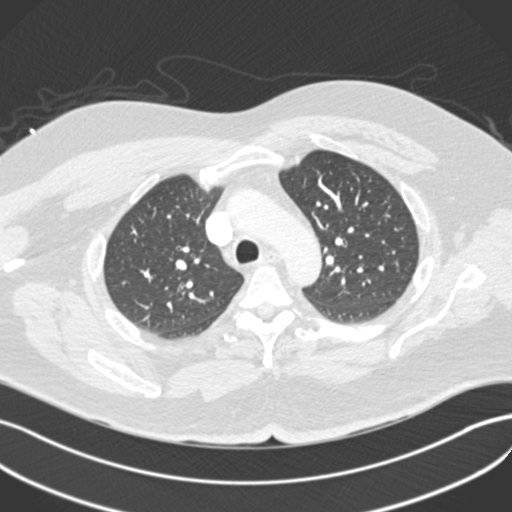
[im 221/284  mediastinal]
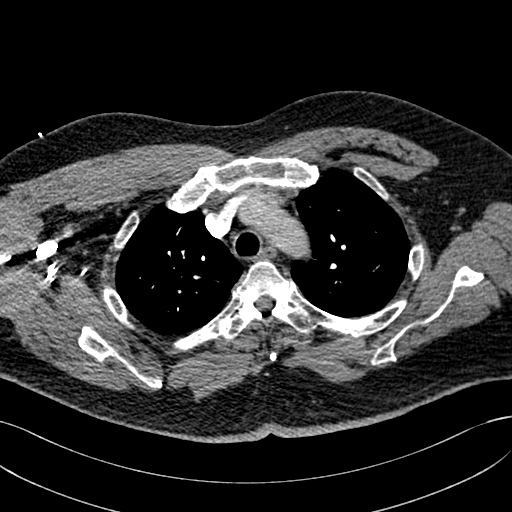
[im 236/284  lung]
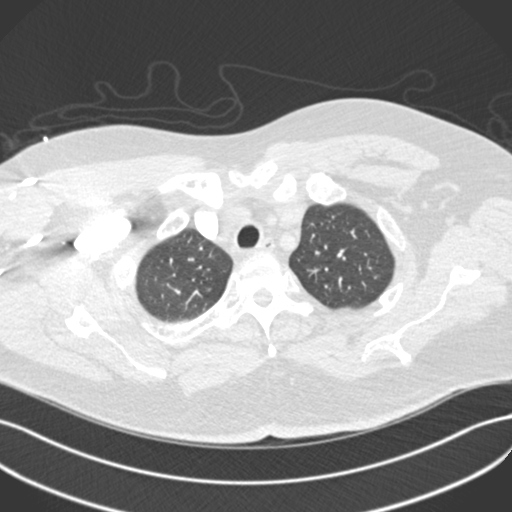
[im 252/284  mediastinal]
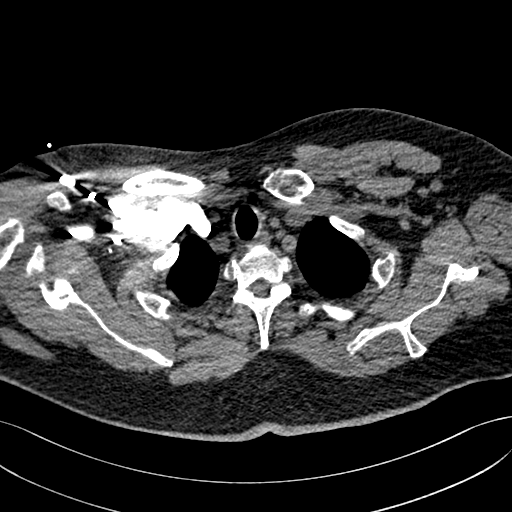
[im 268/284  lung]
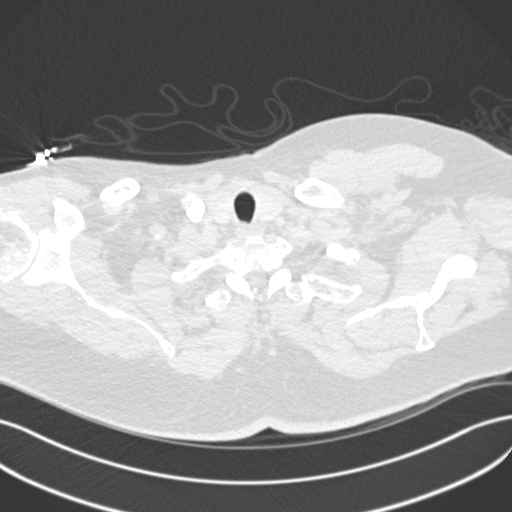

[Series 9: pe coronal mpr · coronal · 0.59mm/px · 1 of 141 slices shown]
[im 71/141  mediastinal]
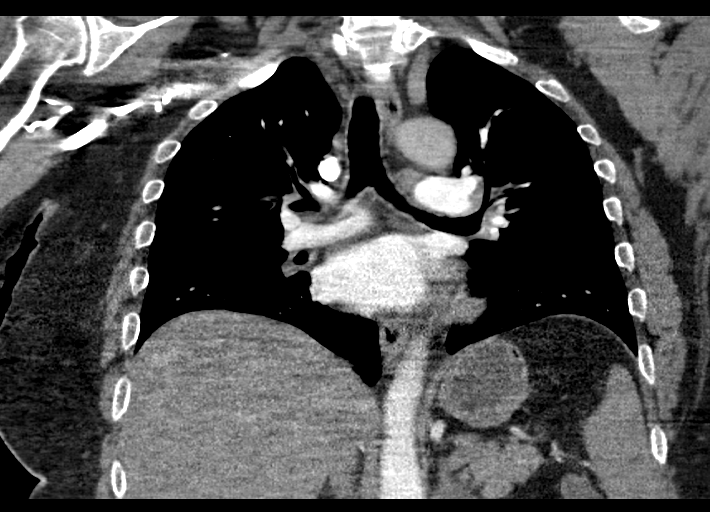

[18 of 36 positions shown; findings below may reference images not displayed]

FINDINGS: Cardiovascular: Satisfactory opacification of the pulmonary arteries
to the lobar level. No evidence of pulmonary embolism. Normal heart
size. No pericardial effusion. Normal caliber thoracic aorta.

Mediastinum/Nodes: No enlarged mediastinal, hilar, or axillary lymph
nodes. Thyroid gland, trachea, and esophagus demonstrate no
significant findings.

Lungs/Pleura: Lungs are clear. No pleural effusion or pneumothorax.
Mild left lower lobe atelectasis.

Upper Abdomen: No acute abnormality.

Musculoskeletal: No chest wall abnormality. No acute or significant
osseous findings.

Review of the MIP images confirms the above findings.
IMPRESSION: 1. Satisfactory opacification of the pulmonary arteries to the lobar
level. No evidence of pulmonary embolism. Segmental and subsegmental
branches are suboptimally visualized.

## 2020-06-06 ENCOUNTER — Encounter: Payer: Self-pay | Admitting: Gastroenterology

## 2020-06-06 ENCOUNTER — Other Ambulatory Visit (INDEPENDENT_AMBULATORY_CARE_PROVIDER_SITE_OTHER): Payer: BC Managed Care – PPO

## 2020-06-06 ENCOUNTER — Other Ambulatory Visit: Payer: Self-pay

## 2020-06-06 ENCOUNTER — Ambulatory Visit (INDEPENDENT_AMBULATORY_CARE_PROVIDER_SITE_OTHER): Payer: BC Managed Care – PPO | Admitting: Gastroenterology

## 2020-06-06 VITALS — BP 138/80 | HR 83 | Ht 73.5 in | Wt 321.0 lb

## 2020-06-06 DIAGNOSIS — K50919 Crohn's disease, unspecified, with unspecified complications: Secondary | ICD-10-CM | POA: Diagnosis not present

## 2020-06-06 DIAGNOSIS — R1084 Generalized abdominal pain: Secondary | ICD-10-CM

## 2020-06-06 DIAGNOSIS — R112 Nausea with vomiting, unspecified: Secondary | ICD-10-CM

## 2020-06-06 DIAGNOSIS — R197 Diarrhea, unspecified: Secondary | ICD-10-CM

## 2020-06-06 LAB — C-REACTIVE PROTEIN: CRP: <1 mg/dL (ref 0.5–20.0)

## 2020-06-06 LAB — FOLATE: Folate: 8.7 ng/mL (ref >5.9)

## 2020-06-06 LAB — VITAMIN D 25 HYDROXY (VIT D DEFICIENCY, FRACTURES): VITD: 22.64 ng/mL — ABNORMAL LOW (ref 30.00–100.00)

## 2020-06-06 LAB — VITAMIN B12: Vitamin B-12: 295 pg/mL (ref 211–911)

## 2020-06-06 LAB — CBC
HCT: 46.8 % (ref 39.0–52.0)
Hemoglobin: 15.5 g/dL (ref 13.0–17.0)
MCHC: 33.1 g/dL (ref 30.0–36.0)
MCV: 84.5 fl (ref 78.0–100.0)
Platelets: 239 10*3/uL (ref 150.0–400.0)
RBC: 5.53 Mil/uL (ref 4.22–5.81)
RDW: 15.7 % — ABNORMAL HIGH (ref 11.5–15.5)
WBC: 7 10*3/uL (ref 4.0–10.5)

## 2020-06-06 LAB — SEDIMENTATION RATE: Sed Rate: 7 mm/hr (ref 0–20)

## 2020-06-06 LAB — FERRITIN: Ferritin: 875.4 ng/mL — ABNORMAL HIGH (ref 22.0–322.0)

## 2020-06-06 MED ORDER — CLENPIQ 10-3.5-12 MG-GM -GM/160ML PO SOLN: 1.0000 | ORAL | 0 refills | Status: DC

## 2020-06-06 NOTE — Progress Notes (Signed)
P  Chief Complaint:    Crohn's Disease  GI History: 50 year old male withstricturing-type (?fistulizing type as well given history in 2013-2014)ileocolonic Crohn's disease diagnosed 12/2017 at time of colonoscopy for routine CRC screening. Initially seen by me on 12/20/2017 for routine colon cancer screening and was otherwise without GI symptoms (although at subsequent appointments endorsed intermittent abdominal pain, loose stools, nausea/vomiting with a few ER evaluations over the years). He does have a long-standing history of RA, previously treated with methotrexate and transitioned to prednisone and leflunomide then Humira.  Has been treated for RA for > 10 years.Was started on Humira 1 week prior to diagnostic colonoscopy for his RA. He has a history of fistulotomy in 2014 and incision and drainage of perirectal abscess in 2013.No recurrence of anorectal sxs since 2014. EGD essentially normal in 01/2018.   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  Currently Humira, sulfasalazine, leflunomide   Health Maintenance:  - DEXA:Coordinates in conjunction with rheumatologist - Vaccinations: - Annual Flu Vaccine:  UTD - Pneumococcal Vaccineif receiving immunosuppression: UTD      - Zoster vaccineif over age 3: Holding due to anti-TNF - Micronutrient eval:  - Annual Vit D, B6, iron panel:Completed 02/2018.  Reordered today - Ileal disease: B12, fat soluble vitamins:Completed 02/2018.  Reordered today - Surveillance colonoscopy:Ordered  today - Surveillance labs for immunomodulators:N/A - Annual depression screening:None - Annual Dermatology/Skin exam:Previously referred  Endoscopic History: -Colonoscopy (12/2017): moderately severe colitis, severely stenosed IC valve, moderate stenosis of proximal ascending colon with biopsies notable for moderately active chronic colitis -EGD (01/2018): None H. pylori gastritis, normal duodenum (path negative)   HPI:     Patient is a 50 y.o. male presenting to the Gastroenterology Clinic for follow-up.  Last seen by me on 04/05/2018.  At that time was on Humira 40 mg  qow, prednisone 50 mg/day, leflunomide 10 mg/day (all prescribed by Rheumatologist).  Had a plan for repeat colonoscopy in 07/2018 to assess for ileal healing, but patient has not returned until today.  He also previously declined CT enterography.  Recommended smoking cessation.  Last seen by his Rheumatologist on 05/28/2020.  Continued Humira, leflunomide, sulfasalazine 500 mg/day.  Was seen in urgent care on 06/04/2020 with abdominal pain, diarrhea, nausea x5 days.  No labs or imaging in UC.  Was diagnosed with suspected superimposed gastroenteritis, prescribed Pepcid and discharged home.  Today, he states he had o/w done well from a GI standpoint since last appt with me nearly 2 years ago. Developed n/v/d 1 week ago. Tried [employer] with some improvement along with Pepto Bismol. Was seen in UC as above. Stools now variable between formed/hard stool and non-bloody diarrhea with LUQ pain. Pain non-radiating.   Labs 05/28/20 (day bfore sxs onset) were largely unremarkable to include CMP, CBC  No new imaging for review.    Review of systems:     No chest pain, no SOB, no fevers, no urinary sx   Past Medical History:  Diagnosis Date  . Amenia   . Anemia   . Anorectal fistula   . Baker's cyst, right 2014  . Diverticulosis   . Hypotension   . Low back pain   .  Monoclonal gammopathy   . Osteoarthritis of hips, bilateral    . Osteoarthritis of lumbar spine   . Perirectal abscess 2013  . Rheumatoid arthritis ([place])   . Spinal stenosis     Patient's surgical history, family medical history, social history, medications and allergies were all reviewed in Epic    Current Outpatient Medications  Medication Sig Dispense Refill  . Adalimumab 40 MG/0.4ML PSKT Inject into the skin.    . Cholecalciferol (VITAMIN D3) 20 MCG (800 UNIT) TABS Take 800 Units by mouth daily. 75 tablet 3  . ergocalciferol (VITAMIN D2) 1.25 MG (50000 UT) capsule Take 1 capsule (50,000 Units total) by mouth once a week. 8 capsule 0  . leflunomide (ARAVA) 10 MG tablet Take 1 tablet by mouth daily.    . predniSONE (DELTASONE) 10 MG tablet Take 1 tablet (10 mg total) by mouth daily with breakfast. Take 2 tabs for the first 7 days. 60 tablet 1  . vitamin B-12 (CYANOCOBALAMIN) 1000 MCG tablet Take 1 tablet (1,000 mcg total) by mouth daily. 90 tablet 3  . VITAMIN D, CHOLECALCIFEROL, PO Take 1 tablet by mouth daily.     Current Facility-Administered Medications  Medication Dose Route Frequency Provider Last Rate Last Admin  . 0.9 %  sodium chloride infusion  500 mL Intravenous Once Cirigliano, Vito V, DO      . 0.9 %  sodium chloride infusion  500 mL Intravenous Once Cirigliano, Vito V, DO        Physical Exam:     Ht 6' 1.5" (1.867 m)   Wt (!) 321 lb (145.6 kg)   BMI 41.78 kg/m   GENERAL:  Pleasant male in NAD PSYCH: : Cooperative, normal affect EENT:  conjunctiva pink, mucous membranes moist, neck supple without masses CARDIAC:  RRR, no murmur heard, no peripheral edema PULM: Normal respiratory effort, lungs CTA bilaterally, no wheezing ABDOMEN: Mild generalized abdominal discomfort without frank TTP.  No rebound or guarding.  No peritoneal signs.  Nondistended, soft.  No obvious masses, no hepatomegaly,  normal bowel sounds SKIN:  turgor, no lesions seen Musculoskeletal:  Normal muscle tone, normal strength NEURO: Alert and oriented x  3, no focal neurologic deficits   IMPRESSION and PLAN:    1) Crohn's Disease: 50 year old male with stricturing (and likely fistulizing) ileocolonic Crohn's disease, which I suspect has been largely kept at [place] with immunosuppression for his RA.  Clinically, seems to have been in remission prior to recent onset GI symptoms in the last week or so.  Unclear if recent symptomatology is flare vs infectious gastroenteritis.  Evaluate as below:  -Check CBC, ESR, CRP -Check fecal calprotectin, GI PCR panel  -Otherwise plan for repeat colonoscopy for dual purpose of Crohn's surveillance along with evaluation for previously noted ileal stricture.  Plan to schedule at [facility], and if he does have persistent fibrotic stricture, plan for balloon dilation -Check annual micronutrient panel -Recommend that he check with his Rheumatologist that he is UTD on DEXA scan and vaccinations -Needs to quit smoking -May eventually need CT enterography to evaluate for small bowel disease  2) Diarrhea 3) Generalized abdominal pain 4) Nausea/vomiting -As above, evaluate for superimposed infectious gastroenteritis  5) Rheumatoid arthritis -Continue close follow-up with Rheumatology  The indications, risks, and benefits of colonoscopy were explained to the patient in detail. Risks include but are not limited to bleeding, perforation, adverse reaction to medications, and cardiopulmonary compromise. Sequelae include but are not limited to the possibility of surgery, hospitalization, and  mortality. The patient verbalized understanding and wished to proceed. All questions answered, referred to the scheduler and bowel prep ordered. Further recommendations pending results of the exam.   RTC in 3 to 4 months or sooner as needed      Lavena Bullion ,DO, FACG 06/06/2020, 9:51 AM

## 2020-06-06 NOTE — Patient Instructions (Signed)
If you are age 52 or older, your body mass index should be between 23-30. Your Body mass index is 41.78 kg/m. If this is out of the aforementioned range listed, please consider follow up with your Primary Care Provider.  If you are age 34 or younger, your body mass index should be between 19-25. Your Body mass index is 41.78 kg/m. If this is out of the aformentioned range listed, please consider follow up with your Primary Care Provider.   Please go to the 2nd floor of this building today and schedule your labwork, [place], [address].  We have sent the following medications to your pharmacy for you to pick up at your convenience:  Clenpiq  Due to recent changes in healthcare laws, you may see the results of your imaging and laboratory studies on MyChart before your provider has had a chance to review them.  We understand that in some cases there may be results that are confusing or concerning to you. Not all laboratory results come back in the same time frame and the provider may be waiting for multiple results in order to interpret others.  Please give [place] 48 hours in order for your provider to thoroughly review all the results before contacting the office for clarification of your results.   Thank you for choosing me and [facility].  Vito Cirigliano, D.O.

## 2020-06-07 ENCOUNTER — Encounter: Payer: Self-pay | Admitting: General Surgery

## 2020-06-07 ENCOUNTER — Telehealth: Payer: Self-pay | Admitting: General Surgery

## 2020-06-07 DIAGNOSIS — E559 Vitamin D deficiency, unspecified: Secondary | ICD-10-CM

## 2020-06-07 LAB — IRON, TOTAL/TOTAL IRON BINDING CAP
%SAT: 23 % (calc) (ref 20–48)
Iron: 69 ug/dL (ref 50–180)
TIBC: 301 mcg/dL (calc) (ref 250–425)

## 2020-06-07 MED ORDER — VITAMIN D (ERGOCALCIFEROL) 1.25 MG (50000 UNIT) PO CAPS: 50000.0000 [IU] | ORAL_CAPSULE | ORAL | 0 refills | Status: AC

## 2020-06-07 NOTE — Telephone Encounter (Signed)
error 

## 2020-06-10 ENCOUNTER — Other Ambulatory Visit: Payer: Self-pay

## 2020-06-10 ENCOUNTER — Other Ambulatory Visit: Payer: BC Managed Care – PPO

## 2020-06-10 DIAGNOSIS — R197 Diarrhea, unspecified: Secondary | ICD-10-CM

## 2020-06-10 DIAGNOSIS — K50919 Crohn's disease, unspecified, with unspecified complications: Secondary | ICD-10-CM

## 2020-06-10 DIAGNOSIS — R1084 Generalized abdominal pain: Secondary | ICD-10-CM

## 2020-06-10 DIAGNOSIS — R112 Nausea with vomiting, unspecified: Secondary | ICD-10-CM

## 2020-06-12 ENCOUNTER — Telehealth: Payer: Self-pay | Admitting: General Surgery

## 2020-06-12 LAB — GI PROFILE, STOOL, PCR
Adenovirus F 40/41: NOT DETECTED
Astrovirus: NOT DETECTED
C difficile toxin A/B: DETECTED — AB
Campylobacter: NOT DETECTED
Cryptosporidium: NOT DETECTED
Cyclospora cayetanensis: NOT DETECTED
Entamoeba histolytica: NOT DETECTED
Enteroaggregative E coli: NOT DETECTED
Enteropathogenic E coli: NOT DETECTED
Enterotoxigenic E coli: NOT DETECTED
Giardia lamblia: NOT DETECTED
Norovirus GI/GII: DETECTED — AB
Plesiomonas shigelloides: NOT DETECTED
Rotavirus A: NOT DETECTED
Salmonella: NOT DETECTED
Sapovirus: NOT DETECTED
Shiga-toxin-producing E coli: NOT DETECTED
Shigella/Enteroinvasive E coli: NOT DETECTED
Vibrio cholerae: NOT DETECTED
Vibrio: NOT DETECTED
Yersinia enterocolitica: NOT DETECTED

## 2020-06-12 MED ORDER — VANCOMYCIN HCL 125 MG PO CAPS: 125.0000 mg | ORAL_CAPSULE | Freq: Four times a day (QID) | ORAL | 0 refills | Status: AC

## 2020-06-12 NOTE — Telephone Encounter (Signed)
Left a voicemail for the patient to contact the office regarding his stool profile test. Also advised him I am sending an rx to his pharmacy,  Sent Vancomycin 125 mg QIDx 14 days

## 2020-06-12 NOTE — Telephone Encounter (Signed)
Called and spoke with Delilah Shan at [facility]- per patient request cancelled his procedure for 06/27/2020

## 2020-06-12 NOTE — Telephone Encounter (Signed)
The patient called and it was explained to him that he has c.difficile and a norovirus. Explained to him that we sent out an vancomycin and that he should also start on a probiotic.He was instructed to stay hydrated and eat. We went over precautions for him to do at home and work to keep his family from getting it. The patient verbalized understanding.  He also stated that he needed to cancel his colonoscopy due to work. He will be able to reschedule in May 2022 when he has vacation time.

## 2020-06-12 NOTE — Telephone Encounter (Signed)
-----   Message from [place], DO sent at 06/12/2020 12:47 PM EDT ----- GI PCR panel notable for both C. difficile toxin positive and norovirus positive.  Neurovirus is treated supportively, but C. difficile requires antibiotic therapy.  Plan as below:  -Vancomycin 125 mg p.o. 4 times daily x14 days -Start probiotic and continue at least 4 weeks beyond completion of antibiotics -Continue adequate hydration and p.o. intake

## 2020-06-12 NOTE — Telephone Encounter (Signed)
-----   Message from [place], DO sent at 06/12/2020 12:47 PM EDT ----- GI PCR panel notable for both C. difficile toxin positive and norovirus positive.  Neurovirus is treated supportively, but C. difficile requires antibiotic therapy.  Plan as below:  -Vancomycin 125 mg p.o. 4 times daily x14 days -Start probiotic and continue at least 4 weeks beyond completion of antibiotics -Continue adequate hydration and p.o. intake

## 2020-06-13 LAB — CALPROTECTIN, FECAL: Calprotectin, Fecal: 59 ug/g (ref 0–120)

## 2020-06-13 NOTE — Telephone Encounter (Signed)
Vancomycin approved effective 05/14/2020 through 06/13/2021. Called pharmacy to update them an refile for the patient.  LVM for the patient.

## 2020-06-21 ENCOUNTER — Telehealth: Payer: Self-pay | Admitting: General Surgery

## 2020-06-21 NOTE — Telephone Encounter (Signed)
Was seen by me in the office on 3/10, and completed labs on 3/14 which demonstrate C. difficile which was treated with vancomycin x14 days.  If he needs FMLA or work note to cover him being out for infectious colitis, that is certainly reasonable given the infectious nature and symptomatology.  Otherwise should be able to return to work since when he is done with antibiotics for his C. difficile.  I am otherwise not anticipating any ongoing missed work time, etc.  Please inquire more to see if I am missing anything since he was last seen by me.  Thanks.

## 2020-06-21 NOTE — Telephone Encounter (Signed)
The patient was in to the office on 06/07/2020. He is seeking intermittent FMLA for DOS 06/04/2020 to ? For his c-diffe. Please advise if we will accomodate him. FMLA papers with TA

## 2020-06-24 ENCOUNTER — Other Ambulatory Visit (HOSPITAL_COMMUNITY): Payer: BC Managed Care – PPO

## 2020-06-25 NOTE — Telephone Encounter (Signed)
Faxed FMLA paperwork to [facility] [phone] fax transmittle  Successful.

## 2020-06-27 ENCOUNTER — Ambulatory Visit (HOSPITAL_COMMUNITY): Admit: 2020-06-27 | Payer: BC Managed Care – PPO | Admitting: Gastroenterology

## 2020-06-27 ENCOUNTER — Encounter (HOSPITAL_COMMUNITY): Payer: Self-pay

## 2020-06-27 SURGERY — COLONOSCOPY WITH PROPOFOL
Anesthesia: Monitor Anesthesia Care

## 2020-06-27 NOTE — Telephone Encounter (Signed)
Sent letter to Kerry Harrison at [facility]. See attached communication

## 2021-11-06 ENCOUNTER — Ambulatory Visit: Payer: BC Managed Care – PPO | Admitting: Gastroenterology

## 2022-01-14 DIAGNOSIS — K5010 Crohn's disease of large intestine without complications: Secondary | ICD-10-CM | POA: Diagnosis not present

## 2022-01-14 DIAGNOSIS — Z79899 Other long term (current) drug therapy: Secondary | ICD-10-CM | POA: Diagnosis not present

## 2022-01-14 DIAGNOSIS — M0579 Rheumatoid arthritis with rheumatoid factor of multiple sites without organ or systems involvement: Secondary | ICD-10-CM | POA: Diagnosis not present

## 2022-03-13 DIAGNOSIS — N202 Calculus of kidney with calculus of ureter: Secondary | ICD-10-CM | POA: Diagnosis not present

## 2022-03-13 DIAGNOSIS — K760 Fatty (change of) liver, not elsewhere classified: Secondary | ICD-10-CM | POA: Diagnosis not present

## 2022-03-13 DIAGNOSIS — N201 Calculus of ureter: Secondary | ICD-10-CM | POA: Diagnosis not present

## 2022-03-13 DIAGNOSIS — I10 Essential (primary) hypertension: Secondary | ICD-10-CM | POA: Diagnosis not present

## 2022-03-13 DIAGNOSIS — N2889 Other specified disorders of kidney and ureter: Secondary | ICD-10-CM | POA: Diagnosis not present

## 2022-03-13 DIAGNOSIS — R42 Dizziness and giddiness: Secondary | ICD-10-CM | POA: Diagnosis not present

## 2022-03-13 DIAGNOSIS — N200 Calculus of kidney: Secondary | ICD-10-CM | POA: Diagnosis not present

## 2022-03-13 DIAGNOSIS — R1084 Generalized abdominal pain: Secondary | ICD-10-CM | POA: Diagnosis not present

## 2022-03-13 DIAGNOSIS — R1110 Vomiting, unspecified: Secondary | ICD-10-CM | POA: Diagnosis not present

## 2022-03-13 DIAGNOSIS — M069 Rheumatoid arthritis, unspecified: Secondary | ICD-10-CM | POA: Diagnosis not present

## 2022-03-13 DIAGNOSIS — Z79899 Other long term (current) drug therapy: Secondary | ICD-10-CM | POA: Diagnosis not present

## 2022-03-13 DIAGNOSIS — R112 Nausea with vomiting, unspecified: Secondary | ICD-10-CM | POA: Diagnosis not present

## 2022-03-13 DIAGNOSIS — R109 Unspecified abdominal pain: Secondary | ICD-10-CM | POA: Diagnosis not present

## 2022-03-13 DIAGNOSIS — R9431 Abnormal electrocardiogram [ECG] [EKG]: Secondary | ICD-10-CM | POA: Diagnosis not present

## 2022-03-13 DIAGNOSIS — N1330 Unspecified hydronephrosis: Secondary | ICD-10-CM | POA: Diagnosis not present

## 2022-03-13 HISTORY — PX: OTHER SURGICAL HISTORY: SHX169

## 2022-03-24 DIAGNOSIS — Z960 Presence of urogenital implants: Secondary | ICD-10-CM | POA: Diagnosis not present

## 2022-03-24 DIAGNOSIS — N401 Enlarged prostate with lower urinary tract symptoms: Secondary | ICD-10-CM | POA: Diagnosis not present

## 2022-03-24 DIAGNOSIS — R350 Frequency of micturition: Secondary | ICD-10-CM | POA: Diagnosis not present

## 2022-03-24 DIAGNOSIS — N201 Calculus of ureter: Secondary | ICD-10-CM | POA: Diagnosis not present

## 2022-06-15 DIAGNOSIS — M0579 Rheumatoid arthritis with rheumatoid factor of multiple sites without organ or systems involvement: Secondary | ICD-10-CM | POA: Diagnosis not present

## 2022-06-15 DIAGNOSIS — Z79899 Other long term (current) drug therapy: Secondary | ICD-10-CM | POA: Diagnosis not present

## 2022-06-15 DIAGNOSIS — K5010 Crohn's disease of large intestine without complications: Secondary | ICD-10-CM | POA: Diagnosis not present

## 2022-09-12 DIAGNOSIS — R21 Rash and other nonspecific skin eruption: Secondary | ICD-10-CM | POA: Diagnosis not present

## 2022-09-12 DIAGNOSIS — L259 Unspecified contact dermatitis, unspecified cause: Secondary | ICD-10-CM | POA: Diagnosis not present

## 2022-09-21 DIAGNOSIS — Z79899 Other long term (current) drug therapy: Secondary | ICD-10-CM | POA: Diagnosis not present

## 2022-09-21 DIAGNOSIS — M0579 Rheumatoid arthritis with rheumatoid factor of multiple sites without organ or systems involvement: Secondary | ICD-10-CM | POA: Diagnosis not present

## 2022-09-21 DIAGNOSIS — K5010 Crohn's disease of large intestine without complications: Secondary | ICD-10-CM | POA: Diagnosis not present

## 2022-12-29 DIAGNOSIS — M0579 Rheumatoid arthritis with rheumatoid factor of multiple sites without organ or systems involvement: Secondary | ICD-10-CM | POA: Diagnosis not present

## 2022-12-29 DIAGNOSIS — K5010 Crohn's disease of large intestine without complications: Secondary | ICD-10-CM | POA: Diagnosis not present

## 2022-12-29 DIAGNOSIS — Z79899 Other long term (current) drug therapy: Secondary | ICD-10-CM | POA: Diagnosis not present

## 2023-03-16 DIAGNOSIS — R42 Dizziness and giddiness: Secondary | ICD-10-CM | POA: Diagnosis not present

## 2023-03-16 DIAGNOSIS — W19XXXA Unspecified fall, initial encounter: Secondary | ICD-10-CM | POA: Diagnosis not present

## 2023-03-16 DIAGNOSIS — R55 Syncope and collapse: Secondary | ICD-10-CM | POA: Diagnosis not present

## 2023-03-16 DIAGNOSIS — F17200 Nicotine dependence, unspecified, uncomplicated: Secondary | ICD-10-CM | POA: Diagnosis not present

## 2023-03-16 DIAGNOSIS — R110 Nausea: Secondary | ICD-10-CM | POA: Diagnosis not present

## 2023-03-16 DIAGNOSIS — R531 Weakness: Secondary | ICD-10-CM | POA: Diagnosis not present

## 2023-03-16 DIAGNOSIS — R944 Abnormal results of kidney function studies: Secondary | ICD-10-CM | POA: Diagnosis not present

## 2023-03-17 DIAGNOSIS — M542 Cervicalgia: Secondary | ICD-10-CM | POA: Diagnosis not present

## 2023-03-17 DIAGNOSIS — M19012 Primary osteoarthritis, left shoulder: Secondary | ICD-10-CM | POA: Diagnosis not present

## 2023-04-13 DIAGNOSIS — M0579 Rheumatoid arthritis with rheumatoid factor of multiple sites without organ or systems involvement: Secondary | ICD-10-CM | POA: Diagnosis not present

## 2023-04-13 DIAGNOSIS — Z79899 Other long term (current) drug therapy: Secondary | ICD-10-CM | POA: Diagnosis not present

## 2023-04-13 DIAGNOSIS — K5010 Crohn's disease of large intestine without complications: Secondary | ICD-10-CM | POA: Diagnosis not present

## 2023-06-29 DIAGNOSIS — M2041 Other hammer toe(s) (acquired), right foot: Secondary | ICD-10-CM | POA: Diagnosis not present

## 2023-06-29 DIAGNOSIS — M25572 Pain in left ankle and joints of left foot: Secondary | ICD-10-CM | POA: Diagnosis not present

## 2023-06-29 DIAGNOSIS — M25571 Pain in right ankle and joints of right foot: Secondary | ICD-10-CM | POA: Diagnosis not present

## 2023-06-29 DIAGNOSIS — M2042 Other hammer toe(s) (acquired), left foot: Secondary | ICD-10-CM | POA: Diagnosis not present

## 2023-06-29 DIAGNOSIS — M12272 Villonodular synovitis (pigmented), left ankle and foot: Secondary | ICD-10-CM | POA: Diagnosis not present

## 2023-06-29 DIAGNOSIS — M12271 Villonodular synovitis (pigmented), right ankle and foot: Secondary | ICD-10-CM | POA: Diagnosis not present

## 2023-06-29 DIAGNOSIS — M79671 Pain in right foot: Secondary | ICD-10-CM | POA: Diagnosis not present

## 2023-06-29 DIAGNOSIS — M79672 Pain in left foot: Secondary | ICD-10-CM | POA: Diagnosis not present

## 2023-07-13 DIAGNOSIS — Z79899 Other long term (current) drug therapy: Secondary | ICD-10-CM | POA: Diagnosis not present

## 2023-07-13 DIAGNOSIS — K5010 Crohn's disease of large intestine without complications: Secondary | ICD-10-CM | POA: Diagnosis not present

## 2023-07-13 DIAGNOSIS — M0579 Rheumatoid arthritis with rheumatoid factor of multiple sites without organ or systems involvement: Secondary | ICD-10-CM | POA: Diagnosis not present

## 2023-07-14 DIAGNOSIS — M25571 Pain in right ankle and joints of right foot: Secondary | ICD-10-CM | POA: Diagnosis not present

## 2023-07-14 DIAGNOSIS — M7671 Peroneal tendinitis, right leg: Secondary | ICD-10-CM | POA: Diagnosis not present

## 2023-07-27 DIAGNOSIS — M1711 Unilateral primary osteoarthritis, right knee: Secondary | ICD-10-CM | POA: Diagnosis not present

## 2023-08-02 DIAGNOSIS — M1711 Unilateral primary osteoarthritis, right knee: Secondary | ICD-10-CM | POA: Diagnosis not present

## 2023-08-12 DIAGNOSIS — M25561 Pain in right knee: Secondary | ICD-10-CM | POA: Diagnosis not present

## 2023-08-17 ENCOUNTER — Telehealth: Payer: Self-pay

## 2023-08-17 NOTE — Telephone Encounter (Signed)
 Patient called and left a message, asking for refill of lidocaine  cream -thanks

## 2023-08-25 ENCOUNTER — Ambulatory Visit: Admitting: Podiatry

## 2023-08-25 DIAGNOSIS — M1711 Unilateral primary osteoarthritis, right knee: Secondary | ICD-10-CM | POA: Diagnosis not present

## 2023-08-25 DIAGNOSIS — M25461 Effusion, right knee: Secondary | ICD-10-CM | POA: Diagnosis not present

## 2023-09-06 ENCOUNTER — Telehealth: Payer: Self-pay | Admitting: Family Medicine

## 2023-09-06 NOTE — Telephone Encounter (Signed)
 Please advise  Copied from CRM [id number]. Topic: Appointments - Scheduling Inquiry for Clinic >> Sep 06, 2023 11:09 AM Freya Jesus wrote: Reason for CRM: Patient spouse called to schedule a new patient establishment appointment with Dr. Camilo Cella Copland. She stated she is a current patient.

## 2023-09-06 NOTE — Telephone Encounter (Signed)
**Note De-identified  [place] Obfuscation** Please advise 

## 2023-09-06 NOTE — Telephone Encounter (Signed)
 Can someone schedule this NP appt please?

## 2023-09-23 ENCOUNTER — Ambulatory Visit: Payer: Self-pay

## 2023-09-23 DIAGNOSIS — Z6841 Body Mass Index (BMI) 40.0 and over, adult: Secondary | ICD-10-CM | POA: Diagnosis not present

## 2023-09-23 DIAGNOSIS — N1330 Unspecified hydronephrosis: Secondary | ICD-10-CM | POA: Diagnosis not present

## 2023-09-23 DIAGNOSIS — Z87442 Personal history of urinary calculi: Secondary | ICD-10-CM | POA: Diagnosis not present

## 2023-09-23 DIAGNOSIS — M19041 Primary osteoarthritis, right hand: Secondary | ICD-10-CM | POA: Diagnosis not present

## 2023-09-23 DIAGNOSIS — N1831 Chronic kidney disease, stage 3a: Secondary | ICD-10-CM | POA: Diagnosis not present

## 2023-09-23 DIAGNOSIS — N131 Hydronephrosis with ureteral stricture, not elsewhere classified: Secondary | ICD-10-CM | POA: Diagnosis not present

## 2023-09-23 DIAGNOSIS — J9620 Acute and chronic respiratory failure, unspecified whether with hypoxia or hypercapnia: Secondary | ICD-10-CM | POA: Diagnosis not present

## 2023-09-23 DIAGNOSIS — M19042 Primary osteoarthritis, left hand: Secondary | ICD-10-CM | POA: Diagnosis not present

## 2023-09-23 DIAGNOSIS — M069 Rheumatoid arthritis, unspecified: Secondary | ICD-10-CM | POA: Diagnosis not present

## 2023-09-23 DIAGNOSIS — N200 Calculus of kidney: Secondary | ICD-10-CM | POA: Diagnosis not present

## 2023-09-23 DIAGNOSIS — M19072 Primary osteoarthritis, left ankle and foot: Secondary | ICD-10-CM | POA: Diagnosis not present

## 2023-09-23 DIAGNOSIS — K5730 Diverticulosis of large intestine without perforation or abscess without bleeding: Secondary | ICD-10-CM | POA: Diagnosis not present

## 2023-09-23 DIAGNOSIS — Z8739 Personal history of other diseases of the musculoskeletal system and connective tissue: Secondary | ICD-10-CM | POA: Diagnosis not present

## 2023-09-23 DIAGNOSIS — R109 Unspecified abdominal pain: Secondary | ICD-10-CM | POA: Diagnosis not present

## 2023-09-23 DIAGNOSIS — N134 Hydroureter: Secondary | ICD-10-CM | POA: Diagnosis not present

## 2023-09-23 DIAGNOSIS — F17200 Nicotine dependence, unspecified, uncomplicated: Secondary | ICD-10-CM | POA: Diagnosis not present

## 2023-09-23 DIAGNOSIS — N179 Acute kidney failure, unspecified: Secondary | ICD-10-CM | POA: Diagnosis not present

## 2023-09-23 DIAGNOSIS — Z79620 Long term (current) use of immunosuppressive biologic: Secondary | ICD-10-CM | POA: Diagnosis not present

## 2023-09-23 DIAGNOSIS — N132 Hydronephrosis with renal and ureteral calculous obstruction: Secondary | ICD-10-CM | POA: Diagnosis not present

## 2023-09-23 DIAGNOSIS — K5090 Crohn's disease, unspecified, without complications: Secondary | ICD-10-CM | POA: Diagnosis not present

## 2023-09-23 DIAGNOSIS — F1090 Alcohol use, unspecified, uncomplicated: Secondary | ICD-10-CM | POA: Diagnosis not present

## 2023-09-23 DIAGNOSIS — N201 Calculus of ureter: Secondary | ICD-10-CM | POA: Diagnosis not present

## 2023-09-23 DIAGNOSIS — M19071 Primary osteoarthritis, right ankle and foot: Secondary | ICD-10-CM | POA: Diagnosis not present

## 2023-09-23 DIAGNOSIS — N400 Enlarged prostate without lower urinary tract symptoms: Secondary | ICD-10-CM | POA: Diagnosis not present

## 2023-09-23 NOTE — Telephone Encounter (Signed)
 FYI Only or Action Required?: FYI only for provider.  Patient was last seen in primary care on No PCP. Called Nurse Triage reporting Abdominal Pain. Symptoms began several days ago. Interventions attempted: OTC medications: Pepto. Symptoms are: lower right abdominal pain, blood in urine, vomitinggradually worsening.  Triage Disposition: Go to ED Now (Notify PCP)  Patient/caregiver understands and will follow disposition?: Yes            Copied from CRM [id number]. Topic: Clinical - Red Word Triage >> Sep 23, 2023 11:04 AM Tobias CROME wrote: Red Word that prompted transfer to Nurse Triage: Pain in right side and abdomen. Pt just went to bathroom and noticed blood in urine. Pt vomited a few seconds after that. Reason for Disposition  [1] SEVERE pain (e.g., excruciating) AND [2] present > 1 hour  Answer Assessment - Initial Assessment Questions 1. LOCATION: Where does it hurt?      Right lower abdomen, vomiting, Blood in urine 2. RADIATION: Does the pain shoot anywhere else? (e.g., chest, back)     Down in pelvis 3. ONSET: When did the pain begin? (Minutes, hours or days ago)      Tuesday afternoon 4. SUDDEN: Gradual or sudden onset?     sudden 5. PATTERN Does the pain come and go, or is it constant?    - If it comes and goes: How long does it last? Do you have pain now?     (Note: Comes and goes means the pain is intermittent. It goes away completely between bouts.)    - If constant: Is it getting better, staying the same, or getting worse?      (Note: Constant means the pain never goes away completely; most serious pain is constant and gets worse.)      constant 6. SEVERITY: How bad is the pain?  (e.g., Scale 1-10; mild, moderate, or severe)    - MILD (1-3): Doesn't interfere with normal activities, abdomen soft and not tender to touch.     - MODERATE (4-7): Interferes with normal activities or awakens from sleep, abdomen tender to touch.     - SEVERE (8-10):  Excruciating pain, doubled over, unable to do any normal activities.       Moderate - severe 7. RECURRENT SYMPTOM: Have you ever had this type of stomach pain before? If Yes, ask: When was the last time? and What happened that time?      Yes - when he had a kidney stone 8. CAUSE: What do you think is causing the stomach pain?     Might be a kidney stone  10. OTHER SYMPTOMS: Do you have any other symptoms? (e.g., back pain, diarrhea, fever, urination pain, vomiting)       Had diarrhea - feels like he needs to have    bm  Protocols used: Abdominal Pain - Male-A-AH

## 2023-09-28 HISTORY — PX: CYSTOSCOPY, WITH RETROGRADE PYELOGRAM, URETEROSCOPY, URINARY CALCULUS LASER LITHOTRIPSY, AND STENT INSERT: SHX7550

## 2023-09-28 HISTORY — PX: PERCUTANEOUS NEPHROSTOMY: SHX2208

## 2023-09-30 DIAGNOSIS — M069 Rheumatoid arthritis, unspecified: Secondary | ICD-10-CM | POA: Diagnosis not present

## 2023-09-30 DIAGNOSIS — N134 Hydroureter: Secondary | ICD-10-CM | POA: Diagnosis not present

## 2023-09-30 DIAGNOSIS — N179 Acute kidney failure, unspecified: Secondary | ICD-10-CM | POA: Diagnosis not present

## 2023-09-30 DIAGNOSIS — Z888 Allergy status to other drugs, medicaments and biological substances status: Secondary | ICD-10-CM | POA: Diagnosis not present

## 2023-09-30 DIAGNOSIS — N131 Hydronephrosis with ureteral stricture, not elsewhere classified: Secondary | ICD-10-CM | POA: Diagnosis not present

## 2023-09-30 DIAGNOSIS — N132 Hydronephrosis with renal and ureteral calculous obstruction: Secondary | ICD-10-CM | POA: Diagnosis not present

## 2023-09-30 DIAGNOSIS — Z436 Encounter for attention to other artificial openings of urinary tract: Secondary | ICD-10-CM | POA: Diagnosis not present

## 2023-09-30 DIAGNOSIS — M19041 Primary osteoarthritis, right hand: Secondary | ICD-10-CM | POA: Diagnosis not present

## 2023-09-30 DIAGNOSIS — Z79620 Long term (current) use of immunosuppressive biologic: Secondary | ICD-10-CM | POA: Diagnosis not present

## 2023-09-30 DIAGNOSIS — Z885 Allergy status to narcotic agent status: Secondary | ICD-10-CM | POA: Diagnosis not present

## 2023-09-30 DIAGNOSIS — M19072 Primary osteoarthritis, left ankle and foot: Secondary | ICD-10-CM | POA: Diagnosis not present

## 2023-09-30 DIAGNOSIS — N135 Crossing vessel and stricture of ureter without hydronephrosis: Secondary | ICD-10-CM | POA: Diagnosis not present

## 2023-09-30 DIAGNOSIS — Z6841 Body Mass Index (BMI) 40.0 and over, adult: Secondary | ICD-10-CM | POA: Diagnosis not present

## 2023-09-30 DIAGNOSIS — Z466 Encounter for fitting and adjustment of urinary device: Secondary | ICD-10-CM | POA: Diagnosis not present

## 2023-09-30 DIAGNOSIS — R231 Pallor: Secondary | ICD-10-CM | POA: Diagnosis not present

## 2023-09-30 DIAGNOSIS — I959 Hypotension, unspecified: Secondary | ICD-10-CM | POA: Diagnosis not present

## 2023-09-30 DIAGNOSIS — K5090 Crohn's disease, unspecified, without complications: Secondary | ICD-10-CM | POA: Diagnosis not present

## 2023-09-30 DIAGNOSIS — Z79899 Other long term (current) drug therapy: Secondary | ICD-10-CM | POA: Diagnosis not present

## 2023-09-30 DIAGNOSIS — R112 Nausea with vomiting, unspecified: Secondary | ICD-10-CM | POA: Diagnosis not present

## 2023-09-30 DIAGNOSIS — N201 Calculus of ureter: Secondary | ICD-10-CM | POA: Diagnosis not present

## 2023-09-30 DIAGNOSIS — M19042 Primary osteoarthritis, left hand: Secondary | ICD-10-CM | POA: Diagnosis not present

## 2023-09-30 DIAGNOSIS — E669 Obesity, unspecified: Secondary | ICD-10-CM | POA: Diagnosis not present

## 2023-09-30 DIAGNOSIS — F17200 Nicotine dependence, unspecified, uncomplicated: Secondary | ICD-10-CM | POA: Diagnosis not present

## 2023-09-30 DIAGNOSIS — Z87442 Personal history of urinary calculi: Secondary | ICD-10-CM | POA: Diagnosis not present

## 2023-09-30 DIAGNOSIS — R109 Unspecified abdominal pain: Secondary | ICD-10-CM | POA: Diagnosis not present

## 2023-09-30 DIAGNOSIS — M19071 Primary osteoarthritis, right ankle and foot: Secondary | ICD-10-CM | POA: Diagnosis not present

## 2023-09-30 DIAGNOSIS — N200 Calculus of kidney: Secondary | ICD-10-CM | POA: Diagnosis not present

## 2023-09-30 DIAGNOSIS — E538 Deficiency of other specified B group vitamins: Secondary | ICD-10-CM | POA: Diagnosis not present

## 2023-10-01 DIAGNOSIS — N200 Calculus of kidney: Secondary | ICD-10-CM | POA: Diagnosis not present

## 2023-10-01 DIAGNOSIS — N135 Crossing vessel and stricture of ureter without hydronephrosis: Secondary | ICD-10-CM | POA: Diagnosis not present

## 2023-10-01 DIAGNOSIS — N201 Calculus of ureter: Secondary | ICD-10-CM | POA: Diagnosis not present

## 2023-10-01 HISTORY — PX: OTHER SURGICAL HISTORY: SHX169

## 2023-10-01 HISTORY — PX: CYSTOSCOPY, WITH RETROGRADE PYELOGRAM, URETEROSCOPY, URINARY CALCULUS LASER LITHOTRIPSY, AND STENT INSERT: SHX7550

## 2023-10-04 NOTE — Discharge Summary (Addendum)
 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  Hospital Course No notes on file Patient admitted urgently due to right ureteral colic.  He underwent ureteroscopic stone manipulation and placement of a stent.  His AKI improved from creatinine 3.5 down to 1.7.  He underwent IR probing of his left ureteral stricture but they were unsuccessful bypassed the stricture with a wire and stent.  He was sent home with his nephrostomy tube in place.  He will follow-up with me later this week for removal of the right DJ stent.  He will have a renal scan obtained.  We will also prescribe potassium citrate and repeat his CMP in the office.  He will be referred  Test Results Pending At Discharge  Pertinent Physical Exam At Time of Discharge Physical Exam Vitals reviewed.  Constitutional:      Appearance: Normal appearance.   Cardiovascular:     Rate and Rhythm: Normal rate and regular rhythm.  Pulmonary:     Effort: Pulmonary effort is normal.     Breath sounds: Normal breath sounds.  Abdominal:     General: Abdomen is flat.     Palpations: Abdomen is soft.   Musculoskeletal:        General: Normal range of motion.   Skin:    General: Skin is warm and dry.   Neurological:     General: No focal deficit present.     Mental Status: He is alert and oriented to person, place, and time.   Psychiatric:        Mood and Affect: Mood normal.        Behavior: Behavior normal.        Thought Content: Thought content normal.       Discharge Medications     Medications To Continue      Sig Disp Refill Start End  adalimumab-ryvk 40 mg/0.4 mL Sykt  Inject 0.4 mL (40 mg total) under the skin every 14 (fourteen) days.  2.4 mL  0     cefdinir 300 mg capsule Commonly known as: OMNICEF  Take 1 capsule (300 mg total) by mouth daily for 3 doses.  3  capsule  0     cholecalciferol 400 unit Cap capsule Commonly known as: VITAMIN D3  Take 1 capsule by mouth daily.   0     cyanocobalamin 1,000 mcg tablet Commonly known as: VITAMIN B12  Take 1,000 mcg by mouth daily.   0     leflunomide 20 mg tablet Commonly known as: ARAVA  Take 1 tablet (20 mg total) by mouth daily.  90 tablet  0     oxyCODONE  5 mg immediate release tablet Commonly known as: ROXICODONE   Take 1 tablet (5 mg total) by mouth every 4 (four) hours as needed for moderate pain (4-6) or severe pain (7-10).  20 tablet  0     sulfaSALAzine 500 mg tablet Commonly known as: AZULFIDINE  Take 1 tablet (500 mg total) by mouth daily Indications: rheumatoid arthritis.  90 tablet  0         Discharge Orders     NM Renogram with Flow and Function     Details:    Release to patient: Immediate   What is study for?: Flow and Function (MAG3)   Port Access approved if needed?: Yes   Scheduling group: [facility]  Additional instructions:  This patient is currently using a home medication that needs to be returned to them prior to discharge.       Issues Requiring Follow-Up A robotics laparoscopic surgeon referral will be made.  Outpatient Follow-Up Future Appointments  Date Time Provider Department Center  10/13/2023  4:00 PM Redell Maffucci Belfi, PA-C [facility]    [facility]Port Referral Information   No data to display

## 2023-10-07 DIAGNOSIS — N201 Calculus of ureter: Secondary | ICD-10-CM | POA: Diagnosis not present

## 2023-10-07 DIAGNOSIS — N135 Crossing vessel and stricture of ureter without hydronephrosis: Secondary | ICD-10-CM | POA: Diagnosis not present

## 2023-10-08 ENCOUNTER — Ambulatory Visit: Admitting: Family

## 2023-10-08 ENCOUNTER — Encounter: Payer: Self-pay | Admitting: Family

## 2023-10-08 VITALS — BP 120/88 | HR 86 | Temp 98.5°F | Resp 18 | Ht 73.5 in | Wt 309.6 lb

## 2023-10-08 DIAGNOSIS — N200 Calculus of kidney: Secondary | ICD-10-CM | POA: Insufficient documentation

## 2023-10-08 DIAGNOSIS — D472 Monoclonal gammopathy: Secondary | ICD-10-CM | POA: Insufficient documentation

## 2023-10-08 DIAGNOSIS — Z125 Encounter for screening for malignant neoplasm of prostate: Secondary | ICD-10-CM

## 2023-10-08 DIAGNOSIS — E538 Deficiency of other specified B group vitamins: Secondary | ICD-10-CM | POA: Insufficient documentation

## 2023-10-08 DIAGNOSIS — E559 Vitamin D deficiency, unspecified: Secondary | ICD-10-CM | POA: Diagnosis not present

## 2023-10-08 DIAGNOSIS — N289 Disorder of kidney and ureter, unspecified: Secondary | ICD-10-CM

## 2023-10-08 DIAGNOSIS — Z0000 Encounter for general adult medical examination without abnormal findings: Secondary | ICD-10-CM | POA: Insufficient documentation

## 2023-10-08 DIAGNOSIS — Z1159 Encounter for screening for other viral diseases: Secondary | ICD-10-CM | POA: Diagnosis not present

## 2023-10-08 DIAGNOSIS — Z114 Encounter for screening for human immunodeficiency virus [HIV]: Secondary | ICD-10-CM

## 2023-10-08 DIAGNOSIS — K5090 Crohn's disease, unspecified, without complications: Secondary | ICD-10-CM | POA: Insufficient documentation

## 2023-10-08 DIAGNOSIS — N135 Crossing vessel and stricture of ureter without hydronephrosis: Secondary | ICD-10-CM | POA: Insufficient documentation

## 2023-10-08 DIAGNOSIS — E785 Hyperlipidemia, unspecified: Secondary | ICD-10-CM | POA: Diagnosis not present

## 2023-10-08 NOTE — Assessment & Plan Note (Signed)
 On vitamin D  supplement per GI.

## 2023-10-08 NOTE — Assessment & Plan Note (Signed)
 Management per Urology.

## 2023-10-08 NOTE — Assessment & Plan Note (Signed)
 On oral b12 1000 mcg daily which is being managed by GI.

## 2023-10-08 NOTE — Assessment & Plan Note (Signed)
 Humira, sulfasalazine, leflunomide- per PA-C. Belfi, Rheumatology.  Has seen Dr. San in the past for colo/endo. Clinically stable.

## 2023-10-08 NOTE — Assessment & Plan Note (Signed)
 He was unaware of this diagnosis.  Update spep. If abnormal refer back to Hematology- he had work up previously around 2018.

## 2023-10-08 NOTE — Patient Instructions (Addendum)
 VISIT SUMMARY:  During your visit, we discussed your ongoing issues with kidney stones and Crohn's disease, as well as your recent urological procedures. We also reviewed your general health maintenance and made plans for upcoming treatments and follow-ups.  YOUR PLAN:  NEPHROLITHIASIS WITH URETERAL STRICTURE: You have kidney stones and a scarred ureter that required multiple surgeries and a nephrostomy tube. -Proceed with ureteral reconstruction surgery on August 26. -Ensure you stay well-hydrated to prevent future kidney stones.  CROHN'S DISEASE: Your Crohn's disease is being managed with medications prescribed by Dr. Lenor. -Continue taking Humira, sulfasalazine, and leflunomide as prescribed.  GENERAL HEALTH MAINTENANCE: We reviewed your overall health, including blood pressure, smoking status, and vitamin D  levels. -Schedule a physical examination three months after your urological treatment. -Continue taking your vitamin D  supplements. -We will check routine labs including PSA, cholesterol, HIV and hepatitis C screening  -Discuss smoking cessation support if you need it.

## 2023-10-08 NOTE — Progress Notes (Unsigned)
 Subjective:     Patient ID: Kerry Harrison, male    DOB: May 30, 1970, 53 y.o.   MRN: [id number]  Chief Complaint  Patient presents with   New Patient (Initial Visit)    HPI  Discussed the use of AI scribe software for clinical note transcription with the patient, who gave verbal consent to proceed.  History of Present Illness  Kerry Harrison is a 53 year old male with kidney stones and Crohn's disease who presents for follow-up on recent urological procedures and ongoing symptoms.  He has experienced kidney stones and complications for 18 months. He was recently admitted to [facility] with right ureteral colic and AKI with Cr of 3.5. He had stone manipulation/stent on the right and probing of his left ureteral stricture. They were unable to pass a wire/stent past the stricture leading to placement of a left nephrostomy tube. He is following with Urology at [facility]) with plan for upcoming surgical repair of his left ureteral stricture.  He has Crohn's disease. He takes Humira, sulfasalazine, leflunomide, and is managed by Gastroenterology-  and vitamin D . B12 supplements are part of his regimen. He recently completed cefdinir, which discolored his urine. Creatinine levels have improved post-stone removal, but he remains concerned about kidney function.  Family history includes kidney stones in his uncle. He is a smoker, abstinent for two weeks, and attempting to quit. He works as a Transport planner and lives with his family, including his wife, daughter, two dogs, and a rabbit.   Health Maintenance Due  Topic Date Due   DTaP/Tdap/Td (1 - Tdap) Never done   Hepatitis B Vaccines (1 of 3 - 19+ 3-dose series) Never done   Zoster Vaccines- Shingrix (1 of 2) Never done   COVID-19 Vaccine (4 - 2024-25 season) 11/29/2022    Past Medical History:  Diagnosis Date   Anemia    Anorectal fistula    Baker's cyst, right 2014   Crohn disease (HCC)    Diverticulosis    Hypotension     Low back pain    Monoclonal gammopathy    Osteoarthritis of hips, bilateral    Osteoarthritis of lumbar spine    Perirectal abscess 2013   Spinal stenosis     Past Surgical History:  Procedure Laterality Date   BONE MARROW BIOPSY  2005   CIRCUMCISION  2013   CYSTOSCOPY W/ URETEROSCOPY Left  Left 03/13/2022   laser lithotripsy   CYSTOSCOPY, WITH RETROGRADE PYELOGRAM, URETEROSCOPY, URINARY CALCULUS LASER LITHOTRIPSY, AND STENT INSERT Right 10/01/2023   laser lithotripsy   EVALUATION UNDER ANESTHESIA WITH ANAL FISTULECTOMY N/A 06/30/2012   Procedure: EXAM UNDER ANESTHESIA  fistulotomy, possible ;  Surgeon: Bernarda Ned, MD;  Location: [facility];  Service: General;  Laterality: N/A;   INCISION AND DRAINAGE PERIRECTAL ABSCESS  09/16/2011   Procedure: IRRIGATION AND DEBRIDEMENT PERIRECTAL ABSCESS;  Surgeon: Redell Faith, DO;  Location: [facility];  Service: General;  Laterality: N/A;   OTHER SURGICAL HISTORY Left 07/2017   bilateral knee surg; unsure what kind   PERCUTANEOUS NEPHROSTOMY Left 09/2023    Family History  Problem Relation Age of Onset   Diabetes Mellitus II Mother    Cancer Father        lung   COPD Father        tobacco abuse   Kidney Stones Brother    Diabetes Paternal Grandmother    Alcohol abuse Paternal Grandfather    Arthritis Paternal Grandfather  Cancer Paternal Grandfather        dont know    Esophageal cancer Paternal Grandfather    Colon cancer Neg Hx        dont know for sure   Rectal cancer Neg Hx    Stomach cancer Neg Hx     Social History   Socioeconomic History   Marital status: Married    Spouse name: Not on file   Number of children: 4   Years of education: Not on file   Highest education level: Not on file  Occupational History   Not on file  Tobacco Use   Smoking status: Former    Current packs/day: 0.00    Types: Cigarettes    Start date: 07/09/2005    Quit date: 07/10/2011    Years since quitting: 12.2    Smokeless tobacco: Never  Vaping Use   Vaping status: Never Used  Substance and Sexual Activity   Alcohol use: No    Comment: not currently- previously drank beer   Drug use: No   Sexual activity: Not Currently  Other Topics Concern   Not on file  Social History Narrative   Married    4 children 2 daughters and 2 sons   One daughter- [employer]   One daughter [place], [place]   One Son in [place]   One Son in [place]   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 : Never true  Transportation Needs: No Transportation Needs (09/30/2023)   Received from [facility]    In the past 12 months, has lack of reliable transportation kept you from medical appointments, meetings, work or from getting things needed for daily living? : No  Physical Activity: Not on file  Stress: Not on file  Social Connections: Not on file  Intimate Partner Violence: Not on file    Outpatient Medications Prior to Visit  Medication Sig Dispense Refill   Adalimumab 40 MG/0.4ML PSKT Inject into the skin.     cefdinir (OMNICEF) 300 MG capsule Take by mouth daily.     cyanocobalamin (VITAMIN B12) 1000 MCG tablet Take 1 tablet by mouth daily.     leflunomide (ARAVA) 10 MG tablet Take 1 tablet by mouth daily.     sulfaSALAzine (AZULFIDINE) 500 MG tablet Take 500 mg by mouth daily.     Vitamin D , Ergocalciferol , (DRISDOL ) 1.25 MG (50000 UNIT) CAPS capsule Take 1 capsule (50,000 Units total) by mouth every 7 (seven) days. 8 capsule 0   predniSONE  (DELTASONE ) 10 MG tablet Take 1 tablet  (10 mg total) by mouth daily with breakfast. Take 2 tabs for the first 7 days. (Patient taking differently: Take 10 mg by mouth as needed. Take 2 tabs for the first 7 days.) 60 tablet 1   Sod Picosulfate-Mag Ox-Cit Acd (CLENPIQ ) 10-3.5-12 MG-GM -GM/160ML SOLN Take 1 kit by mouth as directed. 320 mL 0   Facility-Administered Medications Prior to Visit  Medication Dose Route Frequency Provider Last Rate Last Admin   0.9 %  sodium chloride  infusion  500 mL Intravenous Once Cirigliano, Vito V, DO       0.9 %  sodium chloride  infusion  500 mL Intravenous Once Cirigliano, Vito V, DO        Allergies  Allergen Reactions   Meloxicam Diarrhea and Other (See Comments)    Dizziness   Morphine  And Codeine Other (See Comments)    LAST TIME GIVEN CAUSED HYPOTENSION    ROS    See HPI Objective:    Physical Exam Constitutional:      General: He is not in acute distress.    Appearance: He is well-developed.  HENT:     Head: Normocephalic and atraumatic.  Cardiovascular:     Rate and Rhythm: Normal rate and regular rhythm.     Heart sounds: No murmur heard. Pulmonary:     Effort: Pulmonary effort is normal. No respiratory distress.     Breath sounds: Normal breath sounds. No wheezing or rales.  Genitourinary:    Comments: Urostomy left draining pink urine Skin:    General: Skin is warm and dry.  Neurological:     Mental Status: He is alert and oriented to person, place, and time.  Psychiatric:        Behavior: Behavior normal.        Thought Content: Thought content normal.      BP 120/88 (BP Location: Left Arm, Patient Position: Sitting, Cuff Size: Large)   Pulse 86   Temp 98.5 F (36.9 C) (Oral)   Resp 18   Ht 6' 1.5 (1.867 m)   Wt (!) 309 lb 9.6 oz (140.4 kg)   SpO2 97%   BMI 40.29 kg/m  Wt Readings from Last 3 Encounters:  10/08/23 (!) 309 lb 9.6 oz (140.4 kg)  06/06/20 (!) 321 lb (145.6 kg)  04/05/18 276 lb 6 oz (125.4 kg)       Assessment & Plan:   Problem List  Items Addressed This Visit       Unprioritized   Vitamin D  deficiency   On vitamin D  supplement per GI.      Ureteral stricture   New- has left nephrostomy tube. Plan for surgical repair planned.        Renal insufficiency   Cr 7/10 was down to 1.5 from previous 3.5.      Preventative health care   Relevant Orders   Lipid panel (Completed)   Nephrolithiasis   Management per Urology.       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  Update spep. If abnormal refer back to Hematology- he had work up previously around 2018.       Crohn disease (HCC)   Humira, sulfasalazine, leflunomide- per PA-C. Belfi, Rheumatology.  Has seen Dr. San in the past for colo/endo. Clinically stable.       B12 deficiency   On oral b12 1000 mcg daily which is being managed by GI.       Other Visit Diagnoses       Encounter for hepatitis C screening test for low risk patient       Relevant Orders   Hepatitis C Antibody (Completed)     Encounter for screening for HIV       Relevant Orders   HIV antibody (with reflex) (Completed)     Screening for prostate cancer  Relevant Orders   PSA (Completed)       I have discontinued Kerry RAMAN. Fohl's predniSONE  and Clenpiq . I am also having him maintain his leflunomide, adalimumab, sulfaSALAzine, Vitamin D  (Ergocalciferol ), cefdinir, and cyanocobalamin. We will continue to administer sodium chloride  and sodium chloride .  No orders of the defined types were placed in this encounter.

## 2023-10-10 ENCOUNTER — Encounter: Payer: Self-pay | Admitting: Family

## 2023-10-10 DIAGNOSIS — N289 Disorder of kidney and ureter, unspecified: Secondary | ICD-10-CM | POA: Insufficient documentation

## 2023-10-10 NOTE — Assessment & Plan Note (Signed)
 Cr 7/10 was down to 1.5 from previous 3.5.

## 2023-10-10 NOTE — Assessment & Plan Note (Signed)
 New- has left nephrostomy tube. Plan for surgical repair planned.

## 2023-10-12 LAB — LIPID PANEL
Cholesterol: 211 mg/dL — ABNORMAL HIGH (ref <200)
HDL: 45 mg/dL (ref >=40)
LDL Cholesterol (Calc): 133 mg/dL — ABNORMAL HIGH
Non-HDL Cholesterol (Calc): 166 mg/dL — ABNORMAL HIGH (ref <130)
Total CHOL/HDL Ratio: 4.7 (calc) (ref <5.0)
Triglycerides: 191 mg/dL — ABNORMAL HIGH (ref <150)

## 2023-10-12 LAB — PROTEIN ELECTROPHORESIS, SERUM
Albumin ELP: 4.1 g/dL (ref 3.8–4.8)
Alpha 1: 0.4 g/dL — ABNORMAL HIGH (ref 0.2–0.3)
Alpha 2: 1 g/dL — ABNORMAL HIGH (ref 0.5–0.9)
Beta 2: 0.5 g/dL (ref 0.2–0.5)
Beta Globulin: 0.5 g/dL (ref 0.4–0.6)
Gamma Globulin: 1.7 g/dL (ref 0.8–1.7)
Total Protein: 8.2 g/dL — ABNORMAL HIGH (ref 6.1–8.1)

## 2023-10-12 LAB — HIV ANTIBODY (ROUTINE TESTING W REFLEX): HIV 1&2 Ab, 4th Generation: NONREACTIVE

## 2023-10-12 LAB — PSA: PSA: 2.34 ng/mL (ref <=4.00)

## 2023-10-12 LAB — HEPATITIS C ANTIBODY: Hepatitis C Ab: NONREACTIVE

## 2023-10-14 ENCOUNTER — Ambulatory Visit: Payer: Self-pay | Admitting: Family

## 2023-10-14 DIAGNOSIS — D472 Monoclonal gammopathy: Secondary | ICD-10-CM

## 2023-10-14 DIAGNOSIS — E785 Hyperlipidemia, unspecified: Secondary | ICD-10-CM | POA: Insufficient documentation

## 2023-10-14 NOTE — Telephone Encounter (Signed)
 Please advise pt that his blood work shows the abnormal blood protein that oncology was following him for previously. I think it is important that he get back in with them for follow up. I have placed the referral.   Cholesterol is mildly elevated. Please work on low fat diet/exercise/weight loss.  Other lab work is stable.

## 2023-10-18 DIAGNOSIS — N200 Calculus of kidney: Secondary | ICD-10-CM | POA: Diagnosis not present

## 2023-10-18 DIAGNOSIS — N135 Crossing vessel and stricture of ureter without hydronephrosis: Secondary | ICD-10-CM | POA: Diagnosis not present

## 2023-10-19 DIAGNOSIS — N200 Calculus of kidney: Secondary | ICD-10-CM | POA: Diagnosis not present

## 2023-10-25 ENCOUNTER — Inpatient Hospital Stay (HOSPITAL_BASED_OUTPATIENT_CLINIC_OR_DEPARTMENT_OTHER): Admitting: Hematology & Oncology

## 2023-10-25 ENCOUNTER — Inpatient Hospital Stay: Attending: Hematology & Oncology

## 2023-10-25 ENCOUNTER — Encounter: Payer: Self-pay | Admitting: Hematology & Oncology

## 2023-10-25 VITALS — BP 122/76 | HR 71 | Temp 98.2°F | Resp 19 | Ht 73.0 in | Wt 313.0 lb

## 2023-10-25 DIAGNOSIS — D472 Monoclonal gammopathy: Secondary | ICD-10-CM

## 2023-10-25 DIAGNOSIS — N200 Calculus of kidney: Secondary | ICD-10-CM | POA: Insufficient documentation

## 2023-10-25 DIAGNOSIS — K5090 Crohn's disease, unspecified, without complications: Secondary | ICD-10-CM | POA: Insufficient documentation

## 2023-10-25 LAB — CBC WITH DIFFERENTIAL (CANCER CENTER ONLY)
Abs Immature Granulocytes: 0.02 K/uL (ref 0.00–0.07)
Basophils Absolute: 0 K/uL (ref 0.0–0.1)
Basophils Relative: 1 %
Eosinophils Absolute: 0.2 K/uL (ref 0.0–0.5)
Eosinophils Relative: 3 %
HCT: 42.1 % (ref 39.0–52.0)
Hemoglobin: 14.2 g/dL (ref 13.0–17.0)
Immature Granulocytes: 0 %
Lymphocytes Relative: 37 %
Lymphs Abs: 2.4 K/uL (ref 0.7–4.0)
MCH: 28.3 pg (ref 26.0–34.0)
MCHC: 33.7 g/dL (ref 30.0–36.0)
MCV: 84 fL (ref 80.0–100.0)
Monocytes Absolute: 0.3 K/uL (ref 0.1–1.0)
Monocytes Relative: 5 %
Neutro Abs: 3.5 K/uL (ref 1.7–7.7)
Neutrophils Relative %: 54 %
Platelet Count: 220 K/uL (ref 150–400)
RBC: 5.01 MIL/uL (ref 4.22–5.81)
RDW: 14.6 % (ref 11.5–15.5)
WBC Count: 6.4 K/uL (ref 4.0–10.5)
nRBC: 0 % (ref 0.0–0.2)

## 2023-10-25 LAB — CMP (CANCER CENTER ONLY)
ALT: 35 U/L (ref 0–44)
AST: 34 U/L (ref 15–41)
Albumin: 4.2 g/dL (ref 3.5–5.0)
Alkaline Phosphatase: 73 U/L (ref 38–126)
Anion gap: 10 (ref 5–15)
BUN: 19 mg/dL (ref 6–20)
CO2: 24 mmol/L (ref 22–32)
Calcium: 9.6 mg/dL (ref 8.9–10.3)
Chloride: 106 mmol/L (ref 98–111)
Creatinine: 1.54 mg/dL — ABNORMAL HIGH (ref 0.61–1.24)
GFR, Estimated: 53 mL/min — ABNORMAL LOW (ref >60)
Glucose, Bld: 111 mg/dL — ABNORMAL HIGH (ref 70–99)
Potassium: 4 mmol/L (ref 3.5–5.1)
Sodium: 140 mmol/L (ref 135–145)
Total Bilirubin: 0.4 mg/dL (ref 0.0–1.2)
Total Protein: 8.2 g/dL — ABNORMAL HIGH (ref 6.5–8.1)

## 2023-10-25 LAB — SAVE SMEAR(SSMR), FOR PROVIDER SLIDE REVIEW

## 2023-10-25 LAB — LACTATE DEHYDROGENASE: LDH: 138 U/L (ref 98–192)

## 2023-10-25 NOTE — Progress Notes (Signed)
 Referral MD  Reason for Referral: Abnormal M spike -likely reactive secondary to Crohn's disease  Chief Complaint  Patient presents with   New Patient (Initial Visit)  : I do not know why I am here.  HPI: Kerry Harrison is a very nice 53 year old African-American male.  He has a left nephrostomy tube right now.  He has a problem with recurrent kidney stones.  He has had multiple surgeries for kidney stones..  He also has Crohn's disease.  Thankfully, he has never had a issue with obstruction.  He thinks he has had Crohn's disease for about 10 years.  He is on medications for Crohn's disease.  He is followed by Eleanor Ponto.  Apparently, she noted that he had a abnormal protein.  I went through the records I have.  He had a lab work done back on 10/08/2023.  An SPEP was done.  Unfortunately, the evaluation was pending.,  This has not yet come back.  In go through the records a, back in 2017, he had a monoclonal spike of 1 g/dL.  I never seen any immunoglobulin studies.  She has an other blood work on him.  He does have some minimal renal insufficiency.  He does smoke about a pack of cigarettes a day.  He does have some adult beverages.  He has never had COVID.  As far as he knows, there is been no problems with blood issues in the family.  There is no history of of sickle cell.  He works at a [employer].  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  He has no problems with infections.  He has had no problem with his bones.  He has had no problems with hypercalciemia.  He is originally from [place].  He has been in [place]  for about 30 years.  He already has a 103 year old grandchild.  I am a say that he is incredibly fun to talk to.  He is incredibly eloquent.  Currently, I would say that his performance status is probably ECOG 0.  Past Medical History:  Diagnosis Date   Anemia    Anorectal fistula    Baker's cyst,  right 2014   Crohn disease (HCC)    Diverticulosis    Hypotension    Low back pain    Monoclonal gammopathy    Osteoarthritis of hips, bilateral    Osteoarthritis of lumbar spine    Perirectal abscess 2013   Spinal stenosis   :   Past Surgical History:  Procedure Laterality Date   BONE MARROW BIOPSY  2005   CIRCUMCISION  2013   CYSTOSCOPY W/ URETEROSCOPY Left  Left 03/13/2022   laser lithotripsy   CYSTOSCOPY, WITH RETROGRADE PYELOGRAM, URETEROSCOPY, URINARY CALCULUS LASER LITHOTRIPSY, AND STENT INSERT Right 10/01/2023   laser lithotripsy   EVALUATION UNDER ANESTHESIA WITH ANAL FISTULECTOMY N/A 06/30/2012   Procedure: EXAM UNDER ANESTHESIA  fistulotomy, possible ;  Surgeon: Bernarda Ned, MD;  Location: [facility] [place];  Service: General;  Laterality: N/A;   INCISION AND DRAINAGE PERIRECTAL ABSCESS  09/16/2011   Procedure: IRRIGATION AND DEBRIDEMENT PERIRECTAL ABSCESS;  Surgeon: Redell Faith, DO;  Location: [facility];  Service: General;  Laterality: N/A;   OTHER SURGICAL HISTORY Left 07/2017   bilateral knee surg; unsure what kind   PERCUTANEOUS NEPHROSTOMY Left 09/2023  :   Current Outpatient Medications:    Adalimumab 40 MG/0.4ML PSKT, Inject into the skin.,  Disp: , Rfl:    cyanocobalamin (VITAMIN B12) 1000 MCG tablet, Take 1 tablet by mouth daily., Disp: , Rfl:    leflunomide (ARAVA) 10 MG tablet, Take 1 tablet by mouth daily., Disp: , Rfl:    sulfaSALAzine (AZULFIDINE) 500 MG tablet, Take 500 mg by mouth daily., Disp: , Rfl:    Vitamin D , Ergocalciferol , (DRISDOL ) 1.25 MG (50000 UNIT) CAPS capsule, Take 1 capsule (50,000 Units total) by mouth every 7 (seven) days., Disp: 8 capsule, Rfl: 0  Current Facility-Administered Medications:    0.9 %  sodium chloride  infusion, 500 mL, Intravenous, Once, Cirigliano, Vito V, DO   0.9 %  sodium chloride  infusion, 500 mL, Intravenous, Once, Cirigliano, Vito V, DO:  :   Allergies  Allergen Reactions   Meloxicam Diarrhea and  Other (See Comments)    Dizziness   Morphine  And Codeine Other (See Comments)    LAST TIME GIVEN CAUSED HYPOTENSION  :   Family History  Problem Relation Age of Onset   Diabetes Mellitus II Mother    Cancer Father        lung   COPD Father        tobacco abuse   Kidney Stones Brother    Diabetes Paternal Grandmother    Alcohol abuse Paternal Grandfather    Arthritis Paternal Grandfather    Cancer Paternal Grandfather        dont know    Esophageal cancer Paternal Grandfather    Colon cancer Neg Hx        dont know for sure   Rectal cancer Neg Hx    Stomach cancer Neg Hx   :   Social History   Socioeconomic History   Marital status: Married    Spouse name: Not on file   Number of children: 4   Years of education: Not on file   Highest education level: Not on file  Occupational History   Not on file  Tobacco Use   Smoking status: Former    Current packs/day: 0.00    Types: Cigarettes    Start date: 07/09/2005    Quit date: 07/10/2011    Years since quitting: 12.3   Smokeless tobacco: Never   Tobacco comments:    Quit smoking a month ago. 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   Completed 12th grade   2 dogs and a rabbit   Social Drivers of Corporate investment banker Strain: Not on file  Food Insecurity: No Food Insecurity (10/25/2023)   Hunger Vital Sign    Worried About Running Out of Food in the Last Year: Never true    Ran Out of Food in the Last Year: Never true  Transportation Needs: No Transportation Needs (10/25/2023)   PRAPARE -  Administrator, Civil Service (Medical): No    Lack of Transportation (Non-Medical): No  Physical Activity: Not on file  Stress: Not on file  Social Connections:  Not on file  Intimate Partner Violence: Not At Risk (10/25/2023)   Humiliation, Afraid, Rape, and Kick questionnaire    Fear of Current or Ex-Partner: No    Emotionally Abused: No    Physically Abused: No    Sexually Abused: No  :  Review of Systems  Constitutional: Negative.   HENT: Negative.    Eyes: Negative.   Respiratory: Negative.    Cardiovascular: Negative.   Gastrointestinal: Negative.   Genitourinary:  Positive for dysuria, flank pain and hematuria.  Skin: Negative.   Neurological: Negative.   Endo/Heme/Allergies: Negative.   Psychiatric/Behavioral: Negative.       Exam:  Vital signs show temperature of 98.2.  Pulse 71.  Blood pressure 122/76.  Weight is 313 pounds.  @IPVITALS @ Physical Exam Vitals reviewed.  HENT:     Head: Normocephalic and atraumatic.  Eyes:     Pupils: Pupils are equal, round, and reactive to light.  Cardiovascular:     Rate and Rhythm: Normal rate and regular rhythm.     Heart sounds: Normal heart sounds.  Pulmonary:     Effort: Pulmonary effort is normal.     Breath sounds: Normal breath sounds.  Abdominal:     General: Bowel sounds are normal.     Palpations: Abdomen is soft.     Comments: Abdominal exam is obese but soft.  Has a nephrostomy tube coming out of the left flank.  He has a little bit of tenderness in the flank area bilaterally.  He has decent bowel sounds.  There is no palpable fluid wave.  There is no palpable liver or spleen tip.  Musculoskeletal:        General: No tenderness or deformity. Normal range of motion.     Cervical back: Normal range of motion.  Lymphadenopathy:     Cervical: No cervical adenopathy.  Skin:    General: Skin is warm and dry.     Findings: No erythema or rash.  Neurological:     Mental Status: He is alert and  oriented to person, place, and time.  Psychiatric:        Behavior: Behavior normal.        Thought Content: Thought content normal.        Judgment: Judgment normal.     Recent Labs    10/25/23 1047  WBC 6.4  HGB 14.2  HCT 42.1  PLT 220    Recent Labs    10/25/23 1047  NA 140  K 4.0  CL 106  CO2 24  GLUCOSE 111*  BUN 19  CREATININE 1.54*  CALCIUM 9.6    Blood smear review: Normochromic and normocytic population of red blood cells.  There are no nucleated red blood cells.  I see no rouleaux formation.  He has no teardrop cells.  There are no sickle cells.  He has no schistocytes.  White blood cells appear normal in morphology and maturation.  He has no immature myeloid or lymphoid forms.  Platelets are adequate in number and size.  Platelets are well granulated.  Pathology: None    Assessment and Plan: Kerry Harrison is a very nice 53 year old African-American male.  His main problem has been kidney stones.  He does see Urology for this.  Again I have to believe that this monoclonal spike is going to be secondary to his Crohn's disease.  Since Crohn's disease is a chronic inflammatory state, the body will like to make a monoclonal spike to try to react to this.  I am going to do a 24-hour urine on him just to see if there is any abnormal protein in his urine.  I do not see that we have to do a bone survey on him.  He clearly does not need to have a bone marrow biopsy done.  Depending on what we find with his blood work and his 24-hour urine, this will dictate as a will not need to come back to see us .  Again, I have to believe that this monoclonal spike that he has is gone be quite low and also reactive.  Again he was incredibly delightful to talk to.  He is certainly is at well-known in the [place] area.  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

## 2023-10-26 LAB — IGG, IGA, IGM
IgA: 188 mg/dL (ref 90–386)
IgG (Immunoglobin G), Serum: 2181 mg/dL — ABNORMAL HIGH (ref 603–1613)
IgM (Immunoglobulin M), Srm: 110 mg/dL (ref 20–172)

## 2023-10-26 LAB — KAPPA/LAMBDA LIGHT CHAINS
Kappa free light chain: 54.8 mg/L — ABNORMAL HIGH (ref 3.3–19.4)
Kappa, lambda light chain ratio: 1.39 (ref 0.26–1.65)
Lambda free light chains: 39.3 mg/L — ABNORMAL HIGH (ref 5.7–26.3)

## 2023-10-27 ENCOUNTER — Inpatient Hospital Stay

## 2023-10-27 DIAGNOSIS — D472 Monoclonal gammopathy: Secondary | ICD-10-CM | POA: Diagnosis not present

## 2023-10-28 ENCOUNTER — Ambulatory Visit: Payer: Self-pay | Admitting: Hematology & Oncology

## 2023-10-28 LAB — PROTEIN ELECTROPHORESIS, SERUM, WITH REFLEX
A/G Ratio: 0.8 (ref 0.7–1.7)
Albumin ELP: 3.5 g/dL (ref 2.9–4.4)
Alpha-1-Globulin: 0.2 g/dL (ref 0.0–0.4)
Alpha-2-Globulin: 0.9 g/dL (ref 0.4–1.0)
Beta Globulin: 1.1 g/dL (ref 0.7–1.3)
Gamma Globulin: 1.9 g/dL — ABNORMAL HIGH (ref 0.4–1.8)
Globulin, Total: 4.2 g/dL — ABNORMAL HIGH (ref 2.2–3.9)
M-Spike, %: 0.6 g/dL — ABNORMAL HIGH
SPEP Interpretation: 0
Total Protein ELP: 7.7 g/dL (ref 6.0–8.5)

## 2023-10-28 LAB — IMMUNOFIXATION REFLEX, SERUM
IgA: 195 mg/dL (ref 90–386)
IgG (Immunoglobin G), Serum: 2240 mg/dL — ABNORMAL HIGH (ref 603–1613)
IgM (Immunoglobulin M), Srm: 104 mg/dL (ref 20–172)

## 2023-10-29 LAB — UPEP/UIFE/LIGHT CHAINS/TP, 24-HR UR
% BETA, Urine: 30.4 %
ALPHA 1 URINE: 2.5 %
Albumin, U: 17.2 %
Alpha 2, Urine: 6.9 %
Free Kappa Lt Chains,Ur: 66.39 mg/L (ref 1.17–86.46)
Free Kappa/Lambda Ratio: 7.46 (ref 1.83–14.26)
Free Lambda Lt Chains,Ur: 8.9 mg/L (ref 0.27–15.21)
GAMMA GLOBULIN URINE: 43 %
Total Protein, Urine-Ur/day: 304 mg/(24.h) — ABNORMAL HIGH (ref 30–150)
Total Protein, Urine: 13.8 mg/dL
Total Volume: 2200

## 2023-10-29 NOTE — Telephone Encounter (Signed)
 This nurse called patient and gave him this message from Dr. Timmy.   Let him know that the abnormal protein that he has had for the past 7 years is actually a little bit lower. This is certainly a good sign. Again I suspect this protein is because of inflammatory conditions. However, we will still monitor this. Kerry Harrison   He verbalized understanding of message.

## 2023-10-29 NOTE — Telephone Encounter (Signed)
-----   Message from Maude JONELLE Crease sent at 10/28/2023  4:42 PM EDT ----- Please call and let him know that the abnormal protein that he has had for the past 7 years is actually a little bit lower.  This is certainly a good sign.  Again I suspect this protein is because of  inflammatory conditions.  However, we will still monitor this.  Jeralyn ----- Message ----- From: Rebecka, Lab In [place] Sent: 10/25/2023  11:23 AM EDT To: Maude JONELLE Crease, MD

## 2023-11-01 NOTE — Telephone Encounter (Signed)
-----   Message from Maude JONELLE Crease sent at 10/29/2023  4:47 PM EDT ----- Please call and let him know that the urine that he sent and does not show any abnormal protein.  Great job.  Jeralyn ----- Message ----- From: Rebecka, Lab In [place] Sent: 10/29/2023   4:36 PM EDT To: Maude JONELLE Crease, MD

## 2023-11-01 NOTE — Telephone Encounter (Signed)
 As noted below by Dr. Timmy, I informed him that the urine that he sent does not show any abnormal protein. Great job. Kerry Harrison  Thank You for using MyChart, Williemae Muriel,RN

## 2024-02-15 ENCOUNTER — Encounter: Admitting: Family

## 2024-03-03 ENCOUNTER — Telehealth: Payer: Self-pay | Admitting: Family

## 2024-03-03 DIAGNOSIS — Z87442 Personal history of urinary calculi: Secondary | ICD-10-CM

## 2024-03-03 NOTE — Telephone Encounter (Signed)
 Called twice but no answer, lvm for patient to be aware of referral

## 2024-03-03 NOTE — Telephone Encounter (Signed)
 Received note from his urologist and they are recommending referral to nephrology which I have placed. He should be contacted about this referral.

## 2024-03-10 ENCOUNTER — Encounter: Admitting: Family
